# Patient Record
Sex: Female | Born: 1940 | Race: White | Hispanic: No | Smoking: Never smoker
Health system: Southern US, Community
[De-identification: ages and names within clinical notes are randomized; demographics above are authoritative.]

## PROBLEM LIST (undated history)

## (undated) DIAGNOSIS — F419 Anxiety disorder, unspecified: Secondary | ICD-10-CM

## (undated) DIAGNOSIS — E785 Hyperlipidemia, unspecified: Secondary | ICD-10-CM

## (undated) DIAGNOSIS — K219 Gastro-esophageal reflux disease without esophagitis: Secondary | ICD-10-CM

## (undated) HISTORY — PX: APPENDECTOMY: SHX54

## (undated) HISTORY — PX: CHOLECYSTECTOMY: SHX55

---

## 2003-07-17 ENCOUNTER — Other Ambulatory Visit: Payer: Self-pay

## 2004-08-18 ENCOUNTER — Ambulatory Visit: Payer: Self-pay

## 2004-08-18 ENCOUNTER — Ambulatory Visit: Payer: Self-pay | Admitting: Internal Medicine

## 2005-01-11 ENCOUNTER — Ambulatory Visit: Payer: Self-pay | Admitting: Unknown Physician Specialty

## 2005-10-29 ENCOUNTER — Ambulatory Visit: Payer: Self-pay | Admitting: Internal Medicine

## 2006-11-11 ENCOUNTER — Ambulatory Visit: Payer: Self-pay | Admitting: Specialist

## 2007-04-30 ENCOUNTER — Ambulatory Visit: Payer: Self-pay | Admitting: Internal Medicine

## 2008-03-11 ENCOUNTER — Ambulatory Visit: Payer: Self-pay | Admitting: Urology

## 2008-06-07 ENCOUNTER — Ambulatory Visit: Payer: Self-pay | Admitting: Unknown Physician Specialty

## 2009-12-20 ENCOUNTER — Ambulatory Visit: Payer: Self-pay | Admitting: Internal Medicine

## 2010-06-06 ENCOUNTER — Ambulatory Visit: Payer: Self-pay | Admitting: Unknown Physician Specialty

## 2010-06-07 LAB — PATHOLOGY REPORT

## 2011-05-29 ENCOUNTER — Emergency Department: Payer: Self-pay | Admitting: Unknown Physician Specialty

## 2011-05-29 LAB — URINALYSIS, COMPLETE
Bilirubin,UR: NEGATIVE
Ketone: NEGATIVE
Ph: 6 (ref 4.5–8.0)
RBC,UR: 4 /HPF (ref 0–5)
Squamous Epithelial: <1

## 2011-05-29 LAB — COMPREHENSIVE METABOLIC PANEL
Anion Gap: 11 (ref 7–16)
Calcium, Total: 9.3 mg/dL (ref 8.5–10.1)
Chloride: 103 mmol/L (ref 98–107)
Co2: 25 mmol/L (ref 21–32)
EGFR (African American): >60
EGFR (Non-African Amer.): >60
Glucose: 157 mg/dL — ABNORMAL HIGH (ref 65–99)
SGOT(AST): 76 U/L — ABNORMAL HIGH (ref 15–37)
SGPT (ALT): 46 U/L
Sodium: 139 mmol/L (ref 136–145)

## 2011-05-29 LAB — CBC
HCT: 34.1 % — ABNORMAL LOW (ref 35.0–47.0)
HGB: 11.7 g/dL — ABNORMAL LOW (ref 12.0–16.0)
Platelet: 154 10*3/uL (ref 150–440)
RDW: 13.8 % (ref 11.5–14.5)
WBC: 6.8 10*3/uL (ref 3.6–11.0)

## 2011-05-29 LAB — RAPID INFLUENZA A&B ANTIGENS

## 2012-11-10 ENCOUNTER — Ambulatory Visit: Payer: Self-pay | Admitting: Unknown Physician Specialty

## 2012-11-11 LAB — PATHOLOGY REPORT

## 2013-05-09 ENCOUNTER — Emergency Department: Payer: Self-pay | Admitting: Emergency Medicine

## 2013-05-09 LAB — CBC
HGB: 12.8 g/dL (ref 12.0–16.0)
MCHC: 33.5 g/dL (ref 32.0–36.0)
MCV: 85 fL (ref 80–100)
RBC: 4.46 10*6/uL (ref 3.80–5.20)
RDW: 14.3 % (ref 11.5–14.5)
WBC: 7.7 10*3/uL (ref 3.6–11.0)

## 2013-05-09 LAB — BASIC METABOLIC PANEL
Anion Gap: 9 (ref 7–16)
Calcium, Total: 9.7 mg/dL (ref 8.5–10.1)
Chloride: 107 mmol/L (ref 98–107)
Creatinine: 0.78 mg/dL (ref 0.60–1.30)
EGFR (African American): >60
EGFR (Non-African Amer.): >60
Glucose: 151 mg/dL — ABNORMAL HIGH (ref 65–99)
Osmolality: 281 (ref 275–301)
Sodium: 139 mmol/L (ref 136–145)

## 2013-05-09 LAB — TROPONIN I: Troponin-I: <0.02 ng/mL

## 2014-04-01 ENCOUNTER — Emergency Department: Payer: Self-pay | Admitting: Internal Medicine

## 2014-04-01 LAB — URINALYSIS, COMPLETE
BLOOD: NEGATIVE
Bilirubin,UR: NEGATIVE
Glucose,UR: NEGATIVE mg/dL (ref 0–75)
Ketone: NEGATIVE
Nitrite: NEGATIVE
Ph: 7 (ref 4.5–8.0)
Protein: 30
RBC,UR: 5 /HPF (ref 0–5)
Specific Gravity: 1.012 (ref 1.003–1.030)
WBC UR: 3 /HPF (ref 0–5)

## 2014-04-01 LAB — CBC
HCT: 37.5 % (ref 35.0–47.0)
HGB: 12.7 g/dL (ref 12.0–16.0)
MCH: 29.9 pg (ref 26.0–34.0)
MCHC: 33.8 g/dL (ref 32.0–36.0)
MCV: 89 fL (ref 80–100)
Platelet: 151 10*3/uL (ref 150–440)
RBC: 4.24 10*6/uL (ref 3.80–5.20)
RDW: 13.8 % (ref 11.5–14.5)
WBC: 6.9 10*3/uL (ref 3.6–11.0)

## 2014-04-01 LAB — COMPREHENSIVE METABOLIC PANEL
ALK PHOS: 67 U/L
Albumin: 3.8 g/dL (ref 3.4–5.0)
Anion Gap: 7 (ref 7–16)
BILIRUBIN TOTAL: 0.6 mg/dL (ref 0.2–1.0)
BUN: 10 mg/dL (ref 7–18)
CO2: 26 mmol/L (ref 21–32)
Calcium, Total: 8.9 mg/dL (ref 8.5–10.1)
Chloride: 104 mmol/L (ref 98–107)
Creatinine: 0.8 mg/dL (ref 0.60–1.30)
EGFR (African American): >60
EGFR (Non-African Amer.): >60
GLUCOSE: 128 mg/dL — AB (ref 65–99)
OSMOLALITY: 275 (ref 275–301)
Potassium: 3.9 mmol/L (ref 3.5–5.1)
SGOT(AST): 24 U/L (ref 15–37)
SGPT (ALT): 27 U/L
Sodium: 137 mmol/L (ref 136–145)
Total Protein: 6.9 g/dL (ref 6.4–8.2)

## 2017-04-20 ENCOUNTER — Other Ambulatory Visit: Payer: Self-pay

## 2017-04-20 ENCOUNTER — Encounter: Payer: Self-pay | Admitting: Emergency Medicine

## 2017-04-20 ENCOUNTER — Emergency Department
Admission: EM | Admit: 2017-04-20 | Discharge: 2017-04-21 | Disposition: A | Discharge status: Home / Self Care | Payer: Medicare Other | Attending: Emergency Medicine | Admitting: Emergency Medicine

## 2017-04-20 ENCOUNTER — Emergency Department: Payer: Medicare Other

## 2017-04-20 DIAGNOSIS — Y939 Activity, unspecified: Secondary | ICD-10-CM | POA: Insufficient documentation

## 2017-04-20 DIAGNOSIS — R112 Nausea with vomiting, unspecified: Secondary | ICD-10-CM | POA: Insufficient documentation

## 2017-04-20 DIAGNOSIS — M25511 Pain in right shoulder: Secondary | ICD-10-CM

## 2017-04-20 DIAGNOSIS — S40011A Contusion of right shoulder, initial encounter: Secondary | ICD-10-CM | POA: Insufficient documentation

## 2017-04-20 DIAGNOSIS — Y9285 Railroad track as the place of occurrence of the external cause: Secondary | ICD-10-CM | POA: Diagnosis not present

## 2017-04-20 DIAGNOSIS — E785 Hyperlipidemia, unspecified: Secondary | ICD-10-CM | POA: Diagnosis not present

## 2017-04-20 DIAGNOSIS — R197 Diarrhea, unspecified: Secondary | ICD-10-CM | POA: Insufficient documentation

## 2017-04-20 DIAGNOSIS — Y998 Other external cause status: Secondary | ICD-10-CM | POA: Insufficient documentation

## 2017-04-20 HISTORY — DX: Hyperlipidemia, unspecified: E78.5

## 2017-04-20 HISTORY — DX: Gastro-esophageal reflux disease without esophagitis: K21.9

## 2017-04-20 HISTORY — DX: Anxiety disorder, unspecified: F41.9

## 2017-04-20 LAB — URINALYSIS, COMPLETE (UACMP) WITH MICROSCOPIC
Bilirubin Urine: NEGATIVE
GLUCOSE, UA: NEGATIVE mg/dL
HGB URINE DIPSTICK: NEGATIVE
Ketones, ur: NEGATIVE mg/dL
NITRITE: NEGATIVE
Protein, ur: 30 mg/dL — AB
Specific Gravity, Urine: 1.025 (ref 1.005–1.030)
pH: 5 (ref 5.0–8.0)

## 2017-04-20 LAB — COMPREHENSIVE METABOLIC PANEL
ALBUMIN: 4.3 g/dL (ref 3.5–5.0)
ALT: 18 U/L (ref 14–54)
ANION GAP: 10 (ref 5–15)
AST: 34 U/L (ref 15–41)
Alkaline Phosphatase: 57 U/L (ref 38–126)
BILIRUBIN TOTAL: 0.7 mg/dL (ref 0.3–1.2)
BUN: 16 mg/dL (ref 6–20)
CHLORIDE: 104 mmol/L (ref 101–111)
CO2: 22 mmol/L (ref 22–32)
Calcium: 9.5 mg/dL (ref 8.9–10.3)
Creatinine, Ser: 0.88 mg/dL (ref 0.44–1.00)
GFR calc Af Amer: >60 mL/min (ref >60)
GFR calc non Af Amer: >60 mL/min (ref >60)
GLUCOSE: 162 mg/dL — AB (ref 65–99)
POTASSIUM: 4.2 mmol/L (ref 3.5–5.1)
SODIUM: 136 mmol/L (ref 135–145)
TOTAL PROTEIN: 7.2 g/dL (ref 6.5–8.1)

## 2017-04-20 LAB — CBC
HEMATOCRIT: 37.9 % (ref 35.0–47.0)
HEMOGLOBIN: 12.9 g/dL (ref 12.0–16.0)
MCH: 28.9 pg (ref 26.0–34.0)
MCHC: 34 g/dL (ref 32.0–36.0)
MCV: 85 fL (ref 80.0–100.0)
Platelets: 176 10*3/uL (ref 150–440)
RBC: 4.46 MIL/uL (ref 3.80–5.20)
RDW: 14.9 % — ABNORMAL HIGH (ref 11.5–14.5)
WBC: 8.7 10*3/uL (ref 3.6–11.0)

## 2017-04-20 LAB — TROPONIN I

## 2017-04-20 LAB — LIPASE, BLOOD: Lipase: 43 U/L (ref 11–51)

## 2017-04-20 MED ORDER — SODIUM CHLORIDE 0.9 % IV BOLUS (SEPSIS)
1000.0000 mL | Freq: Once | INTRAVENOUS | Status: AC
  Administered 2017-04-20: 1000 mL via INTRAVENOUS

## 2017-04-20 MED ORDER — ACETAMINOPHEN 500 MG PO TABS
1000.0000 mg | ORAL_TABLET | Freq: Once | ORAL | Status: AC
  Administered 2017-04-20: 1000 mg via ORAL

## 2017-04-20 MED ORDER — ACETAMINOPHEN 500 MG PO TABS
ORAL_TABLET | ORAL | Status: AC
  Administered 2017-04-20: 1000 mg via ORAL
  Filled 2017-04-20: qty 2

## 2017-04-20 MED ORDER — ONDANSETRON HCL 4 MG/2ML IJ SOLN
4.0000 mg | Freq: Once | INTRAMUSCULAR | Status: AC | PRN
  Administered 2017-04-20: 4 mg via INTRAVENOUS
  Filled 2017-04-20: qty 2

## 2017-04-20 NOTE — ED Notes (Signed)
Pt resting comfortably in sub wait. Pt states that she is feeling better at this time.

## 2017-04-20 NOTE — ED Triage Notes (Signed)
First nurse note: Patient with complaint of vomiting and diarrhea that started about 5 hours ago. Patient was on a train at the time and when the train started she fell and hit her right shoulder. Patient reports that she was given some nausea medication and that has helped with the nausea.

## 2017-04-20 NOTE — ED Triage Notes (Signed)
Pt reports that she was riding on a train from [facility] and was experiencing N/V/D for the entire train ride. Pt also reports falling into a poll on the train with her right shoulder. Pt denies cardiac symptoms at this time and is in NAD.

## 2017-04-20 NOTE — ED Provider Notes (Signed)
[facility] Emergency Department Provider Note   ____________________________________________   First MD Initiated Contact with Patient 04/20/17 2310     (approximate)  I have reviewed the triage vital signs and the nursing notes.   HISTORY  Chief Complaint Emesis    HPI Jillian Oconnor is a 76 y.o. female who presents to the ED from train with a chief complaint of an/V/D and right shoulder pain.  Patient was heading home on a train from [place], [place] this afternoon when she experienced nausea, vomiting and diarrhea which she thinks was from eating a piece of white cheese.  Went multiple times on the train.  At one point the train jerked to a stop and she fell into a pole, striking her right shoulder.  Denies striking head or LOC.  The train stopped in [place] to let her out.  She was examined by EMTs who gave her an ODT Zofran and she opted to be evaluated here in [place] where she lives rather than to go to [facility].  Reports relief of symptoms from ODT Zofran.  Denies recent fever, chills, chest pain, shortness of breath, dysuria.   Past Medical History:  Diagnosis Date  . Anxiety   . GERD (gastroesophageal reflux disease)   . Hyperlipidemia     There are no active problems to display for this patient.   Past Surgical History:  Procedure Laterality Date  . APPENDECTOMY    . CHOLECYSTECTOMY      Prior to Admission medications   Not on File    Allergies Valium [diazepam]  No family history on file.  Social History Social History   Tobacco Use  . Smoking status: Never Smoker  . Smokeless tobacco: Never Used  Substance Use Topics  . Alcohol use: No    Frequency: Never  . Drug use: No    Review of Systems  Constitutional: No fever/chills. Eyes: No visual changes. ENT: No sore throat. Cardiovascular: Denies chest pain. Respiratory: Denies shortness of breath. Gastrointestinal: No abdominal pain.  Positive for  nausea, vomiting and diarrhea.  No constipation. Genitourinary: Negative for dysuria. Musculoskeletal: Positive for right shoulder pain.  Negative for back pain. Skin: Negative for rash. Neurological: Negative for headaches, focal weakness or numbness.   ____________________________________________   PHYSICAL EXAM:  VITAL SIGNS: ED Triage Vitals  Enc Vitals Group     BP 04/20/17 1956 (!) 122/56     Pulse Rate 04/20/17 1956 85     Resp 04/20/17 1956 (!) 22     Temp 04/20/17 1956 (!) 97.4 F (36.3 C)     Temp Source 04/20/17 1956 Oral     SpO2 04/20/17 1956 94 %     Weight 04/20/17 1957 142 lb (64.4 kg)     Height 04/20/17 1957 5\' 2"  (1.575 m)     Head Circumference --      Peak Flow --      Pain Score 04/20/17 2005 8     Pain Loc --      Pain Edu? --      Excl. in GC? --     Constitutional: Alert and oriented. Well appearing and in no acute distress. Eyes: Conjunctivae are normal. PERRL. EOMI. Head: Atraumatic. Nose: No congestion/rhinnorhea. Mouth/Throat: Mucous membranes are moist.  Oropharynx non-erythematous. Neck: No stridor.  No cervical spine tenderness to palpation. Cardiovascular: Normal rate, regular rhythm. Grossly normal heart sounds.  Good peripheral circulation. Respiratory: Normal respiratory effort.  No retractions. Lungs CTAB. Gastrointestinal: Soft  and nontender to light or deep palpation. No distention. No abdominal bruits. No CVA tenderness. Musculoskeletal: Right anterior shoulder tender to palpation with limited range of motion secondary to pain.  2+ radial pulses.  Brisk, less than 5-second capillary refill.  No lower extremity tenderness nor edema.  No joint effusions. Neurologic:  Normal speech and language. No gross focal neurologic deficits are appreciated. No gait instability. Skin:  Skin is warm, dry and intact. No rash noted. Psychiatric: Mood and affect are normal. Speech and behavior are  normal.  ____________________________________________   LABS (all labs ordered are listed, but only abnormal results are displayed)  Labs Reviewed  COMPREHENSIVE METABOLIC PANEL - Abnormal; Notable for the following components:      Result Value   Glucose, Bld 162 (*)    All other components within normal limits  CBC - Abnormal; Notable for the following components:   RDW 14.9 (*)    All other components within normal limits  URINALYSIS, COMPLETE (UACMP) WITH MICROSCOPIC - Abnormal; Notable for the following components:   Color, Urine YELLOW (*)    APPearance CLEAR (*)    Protein, ur 30 (*)    Leukocytes, UA TRACE (*)    Bacteria, UA RARE (*)    Squamous Epithelial / LPF 0-5 (*)    All other components within normal limits  LIPASE, BLOOD  TROPONIN I   ____________________________________________  EKG  None ____________________________________________  RADIOLOGY  Dg Shoulder Right  Result Date: 04/20/2017 CLINICAL DATA:  Right shoulder pain after falling into a pole on train. EXAM: RIGHT SHOULDER - 2+ VIEW COMPARISON:  None. FINDINGS: There is no evidence of fracture or dislocation. Mild inferior glenoid spurring consistent with osteoarthritis. Tiny subacromial spur. Soft tissues are unremarkable. IMPRESSION: No fracture or subluxation.  Mild osteoarthritis. Electronically Signed   By: Rubye Oaks M.D.   On: 04/20/2017 21:17    ____________________________________________   PROCEDURES  Procedure(s) performed: None  Procedures  Critical Care performed: No  ____________________________________________   INITIAL IMPRESSION / ASSESSMENT AND PLAN / ED COURSE  As part of my medical decision making, I reviewed the following data within the electronic MEDICAL RECORD NUMBER History obtained from family, Nursing notes reviewed and incorporated, Labs reviewed and Notes from prior ED visits.   76 year old female who presents with nausea, vomiting, diarrhea and right  shoulder pain status post striking a pole on the train.  Differential diagnosis includes but is not limited to gastroenteritis, colitis, diverticulitis, UTI.  Initial laboratory results unremarkable.  Awaiting urinalysis result.  Patient received 1L IV fluids prior to my arrival and is feeling much better.  Requested Tylenol for shoulder pain.  Asking for applesauce.  Clinical Course as of Apr 21 30  Wynelle Link Apr 21, 2017  0030 Patient tolerated applesauce without emesis.  Overall feels much better.  Urinalysis notable for trace leukocytes only; patient asymptomatic.  Will culture urine.  Will discharge home with Zofran prescription to use as needed.  Strict return precautions given.  Patient and daughter verbalize understanding and agree with plan of care.  [JS]    Clinical Course User Index [JS] Irean Hong, MD     ____________________________________________   FINAL CLINICAL IMPRESSION(S) / ED DIAGNOSES  Final diagnoses:  Nausea vomiting and diarrhea  Acute pain of right shoulder  Contusion of right shoulder, initial encounter     ED Discharge Orders    None       Note:  This document was prepared using Dragon voice recognition software  and may include unintentional dictation errors.    Irean HongSung, Jade J, MD 04/21/17 [phone]

## 2017-04-21 MED ORDER — ONDANSETRON 4 MG PO TBDP: 4.0000 mg | ORAL_TABLET | Freq: Three times a day (TID) | ORAL | 0 refills | Status: DC | PRN

## 2017-04-21 NOTE — ED Notes (Signed)
Pt reports no c/o nausea, vomiting, or abdominal discomfort after eating apple sauce.

## 2017-04-21 NOTE — Discharge Instructions (Signed)
1.  You may take Zofran as needed for nausea. 2.  Clear liquids times 12 hours, then bland diet x 3 days, then slowly advance diet as tolerated. 3.  Wear sling as needed for comfort. 4.  Return to the ER for worsening symptoms, persistent vomiting, difficulty breathing or other concerns.

## 2018-02-27 ENCOUNTER — Other Ambulatory Visit: Payer: Self-pay | Admitting: Internal Medicine

## 2018-02-27 DIAGNOSIS — R270 Ataxia, unspecified: Secondary | ICD-10-CM

## 2018-03-17 ENCOUNTER — Ambulatory Visit
Admission: RE | Admit: 2018-03-17 | Discharge: 2018-03-17 | Disposition: A | Discharge status: Home / Self Care | Payer: Medicare Other | Source: Ambulatory Visit | Attending: Internal Medicine | Admitting: Internal Medicine

## 2018-03-17 DIAGNOSIS — R251 Tremor, unspecified: Secondary | ICD-10-CM | POA: Insufficient documentation

## 2018-03-17 DIAGNOSIS — I6782 Cerebral ischemia: Secondary | ICD-10-CM | POA: Insufficient documentation

## 2018-03-17 DIAGNOSIS — R296 Repeated falls: Secondary | ICD-10-CM | POA: Diagnosis not present

## 2018-03-17 DIAGNOSIS — R270 Ataxia, unspecified: Secondary | ICD-10-CM | POA: Diagnosis not present

## 2018-03-17 MED ORDER — GADOBUTROL 1 MMOL/ML IV SOLN
6.5000 mL | Freq: Once | INTRAVENOUS | Status: AC | PRN
  Administered 2018-03-17: 6.5 mL via INTRAVENOUS

## 2021-08-17 ENCOUNTER — Encounter: Payer: Self-pay | Admitting: Emergency Medicine

## 2021-08-17 ENCOUNTER — Other Ambulatory Visit: Payer: Self-pay

## 2021-08-17 ENCOUNTER — Emergency Department: Payer: Medicare PPO

## 2021-08-17 ENCOUNTER — Emergency Department
Admission: EM | Admit: 2021-08-17 | Discharge: 2021-08-17 | Disposition: A | Discharge status: Home / Self Care | Payer: Medicare PPO | Attending: Emergency Medicine | Admitting: Emergency Medicine

## 2021-08-17 DIAGNOSIS — M25561 Pain in right knee: Secondary | ICD-10-CM | POA: Diagnosis not present

## 2021-08-17 MED ORDER — ACETAMINOPHEN 325 MG PO TABS
325.0000 mg | ORAL_TABLET | Freq: Once | ORAL | Status: AC
  Administered 2021-08-17: 325 mg via ORAL
  Filled 2021-08-17: qty 1

## 2021-08-17 MED ORDER — OXYCODONE-ACETAMINOPHEN 5-325 MG PO TABS
1.0000 | ORAL_TABLET | Freq: Once | ORAL | Status: AC
  Administered 2021-08-17: 1 via ORAL
  Filled 2021-08-17: qty 1

## 2021-08-17 NOTE — ED Notes (Signed)
Refused crutches, will use her cane that's at home, will wheelchair her to the lobby when her ride gets here ? ?

## 2021-08-17 NOTE — Discharge Instructions (Addendum)
We believe that your symptoms are caused by musculoskeletal pain that may be worse in the short-term because of the injection.  However we believe the pain will most likely be better by tomorrow.  Please consider using either a heating pad or ice packs on your knee, which ever makes you feel better.  Use Tylenol according to label instructions (no more than 2 extra strength Tylenol every 6 hours).   ? ?Follow-up with the doctor listed as recommended or return to the emergency department with new or worsening symptoms that concern you. ?

## 2021-08-17 NOTE — ED Provider Notes (Signed)
? ?[facility] ?Provider Note ? ? ? Event Date/Time  ? First MD Initiated Contact with Patient 08/17/21 0116   ?  (approximate) ? ? ?History  ? ?Knee Pain ? ? ?HPI ? ?Jillian Oconnor is a 81 y.o. female who has been having right-sided knee pain for a couple of weeks.  She presents for acute worsening of that pain.  She says it is in the top of the knee and on the inside, but not in the back.  She has had no swelling.  She has been seen twice at [facility] clinic including just yesterday where she saw Mahalia Longest who gave her an injection to try to help with the pain..  The thought is that she has worsening arthritis and they are going to try managing it through [facility] clinic before referring her to orthopedics. ? ?She said that she was doing fine and was even able to walk and drive herself to and from the appointment today.  However tonight after sitting still for a while, she got up and try to walk and the pain was much worse and she felt like she could not bear weight.  There is no swelling of the area and no swelling anywhere else on her leg.  No history of blood clots to the legs of the lungs.  No recent immobilizations.  Pain seem to be acutely worse before coming into the ED but she said it actually feels better than it did before.  She cannot tolerate NSAIDs and was recently on a prednisone taper to see if it would help. ?  ? ? ?Physical Exam  ? ?Triage Vital Signs: ?ED Triage Vitals  ?Enc Vitals Group  ?   BP 08/17/21 0018 (!) 188/86  ?   Pulse Rate 08/17/21 0018 85  ?   Resp --   ?   Temp 08/17/21 0018 98.1 ?F (36.7 ?C)  ?   Temp Source 08/17/21 0018 Oral  ?   SpO2 08/17/21 0018 97 %  ?   Weight 08/17/21 0008 47.6 kg (105 lb)  ?   Height 08/17/21 0008 1.27 m (4\' 2" )  ?   Head Circumference --   ?   Peak Flow --   ?   Pain Score 08/17/21 0007 9  ?   Pain Loc --   ?   Pain Edu? --   ?   Excl. in GC? --   ? ? ?Most recent vital signs: ?Vitals:  ? 08/17/21 0018 08/17/21 0138  ?BP:  (!) 188/86 (!) 164/98  ?Pulse: 85   ?Resp:  16  ?Temp: 98.1 ?F (36.7 ?C) 98 ?F (36.7 ?C)  ?SpO2: 97% 98%  ? ? ? ?General: Awake, no distress.  ?CV:  Good peripheral perfusion.  ?Resp:  Normal effort.  ?Abd:  No distention.  ?Other:  I see the injection site on the right knee from earlier today/yesterday, but there is no gross swelling and no evidence of cellulitis or effusion.  The patient is flexing and extending her knee without any difficulty.  No evidence of septic arthritis.  She reports pain when she bears weight but she now is able to tolerate weightbearing and taking a few steps.  No tenderness to palpation of the calf, no induration along venous lines.  No pain or tenderness in the popliteal fossa.  No swelling of the right leg compared to the left. ? ? ?ED Results / Procedures / Treatments  ? ? ?RADIOLOGY ? ?I personally  reviewed the patient's 4 view knee x-rays.  I see no evidence of any acute abnormality.  Radiology report confirms the same. ? ? ?PROCEDURES: ? ?Critical Care performed: No ? ?Procedures ? ? ?MEDICATIONS ORDERED IN ED: ?Medications  ?oxyCODONE-acetaminophen (PERCOCET/ROXICET) 5-325 MG per tablet 1 tablet (1 tablet Oral Given 08/17/21 0133)  ?acetaminophen (TYLENOL) tablet 325 mg (325 mg Oral Given 08/17/21 0133)  ? ? ? ?IMPRESSION / MDM / ASSESSMENT AND PLAN / ED COURSE  ?I reviewed the triage vital signs and the nursing notes. ?             ?               ? ?Differential diagnosis includes, but is not limited to, chronic arthritis with acute worsening, acute worsening of pain after injection, septic arthritis, cellulitis, DVT. ? ?No evidence of DVT.  The patient's pain has been ongoing for a while and is much more consistent with arthritis.  I believe that she has chronic right knee pain that was acutely worse today after the injection.  She even said that it was worse when she tried to walk after sitting for a while and I believe this is due to the injection.  I reviewed her x-rays as  documented above and they are reassuring.  Her vital signs are stable other than some hypertension.  There is no indication for further imaging.  The patient says that the pain is better than it was before but just wants something for pain to make it go back to the way it was before the injection. ? ?I suggested that we give her 1 Percocet tonight and at the 325 mg acetaminophen.  She is comfortable with that plan and she will call the clinic in the morning for follow-up appointment.  She declines crutches and she declines orthopedic referral.  I gave my usual and customary return precautions. ? ? ? ? ?  ? ? ?FINAL CLINICAL IMPRESSION(S) / ED DIAGNOSES  ? ?Final diagnoses:  ?Acute pain of right knee  ? ? ? ?Rx / DC Orders  ? ?ED Discharge Orders   ? ? None  ? ?  ? ? ? ?Note:  This document was prepared using Dragon voice recognition software and may include unintentional dictation errors. ?  ?Loleta Rose, MD ?08/17/21 0203 ? ?

## 2021-08-17 NOTE — ED Triage Notes (Signed)
Pt to triage via recliner with no distress noted; received cortisone inj to rt knee today for "arthritis"; c/o increasing rt knee pain; denies any new injury ?

## 2023-07-19 ENCOUNTER — Emergency Department (HOSPITAL_COMMUNITY): Payer: Medicare PPO

## 2023-07-19 ENCOUNTER — Other Ambulatory Visit: Payer: Self-pay

## 2023-07-19 ENCOUNTER — Encounter (HOSPITAL_COMMUNITY): Payer: Self-pay

## 2023-07-19 ENCOUNTER — Inpatient Hospital Stay (HOSPITAL_COMMUNITY): Payer: Medicare PPO

## 2023-07-19 ENCOUNTER — Inpatient Hospital Stay (HOSPITAL_COMMUNITY)
Admission: EM | Admit: 2023-07-19 | Discharge: 2023-07-27 | DRG: 023 | Disposition: E | Payer: Medicare PPO | Attending: Internal Medicine | Admitting: Internal Medicine

## 2023-07-19 DIAGNOSIS — Z1152 Encounter for screening for COVID-19: Secondary | ICD-10-CM | POA: Diagnosis not present

## 2023-07-19 DIAGNOSIS — I10 Essential (primary) hypertension: Secondary | ICD-10-CM | POA: Diagnosis present

## 2023-07-19 DIAGNOSIS — K219 Gastro-esophageal reflux disease without esophagitis: Secondary | ICD-10-CM | POA: Diagnosis present

## 2023-07-19 DIAGNOSIS — I6389 Other cerebral infarction: Secondary | ICD-10-CM | POA: Diagnosis not present

## 2023-07-19 DIAGNOSIS — G9341 Metabolic encephalopathy: Secondary | ICD-10-CM | POA: Diagnosis present

## 2023-07-19 DIAGNOSIS — G8191 Hemiplegia, unspecified affecting right dominant side: Secondary | ICD-10-CM | POA: Diagnosis present

## 2023-07-19 DIAGNOSIS — Z888 Allergy status to other drugs, medicaments and biological substances status: Secondary | ICD-10-CM

## 2023-07-19 DIAGNOSIS — I609 Nontraumatic subarachnoid hemorrhage, unspecified: Secondary | ICD-10-CM | POA: Diagnosis present

## 2023-07-19 DIAGNOSIS — E876 Hypokalemia: Secondary | ICD-10-CM | POA: Diagnosis not present

## 2023-07-19 DIAGNOSIS — E785 Hyperlipidemia, unspecified: Secondary | ICD-10-CM | POA: Diagnosis present

## 2023-07-19 DIAGNOSIS — R1310 Dysphagia, unspecified: Secondary | ICD-10-CM | POA: Diagnosis present

## 2023-07-19 DIAGNOSIS — Z79899 Other long term (current) drug therapy: Secondary | ICD-10-CM

## 2023-07-19 DIAGNOSIS — R4701 Aphasia: Secondary | ICD-10-CM | POA: Diagnosis present

## 2023-07-19 DIAGNOSIS — R29810 Facial weakness: Secondary | ICD-10-CM | POA: Diagnosis present

## 2023-07-19 DIAGNOSIS — I63412 Cerebral infarction due to embolism of left middle cerebral artery: Secondary | ICD-10-CM | POA: Diagnosis present

## 2023-07-19 DIAGNOSIS — Z7982 Long term (current) use of aspirin: Secondary | ICD-10-CM

## 2023-07-19 DIAGNOSIS — J95821 Acute postprocedural respiratory failure: Secondary | ICD-10-CM | POA: Diagnosis not present

## 2023-07-19 DIAGNOSIS — Z7902 Long term (current) use of antithrombotics/antiplatelets: Secondary | ICD-10-CM

## 2023-07-19 DIAGNOSIS — I161 Hypertensive emergency: Secondary | ICD-10-CM | POA: Diagnosis present

## 2023-07-19 DIAGNOSIS — I63512 Cerebral infarction due to unspecified occlusion or stenosis of left middle cerebral artery: Secondary | ICD-10-CM

## 2023-07-19 DIAGNOSIS — Z515 Encounter for palliative care: Secondary | ICD-10-CM

## 2023-07-19 DIAGNOSIS — R001 Bradycardia, unspecified: Secondary | ICD-10-CM | POA: Diagnosis present

## 2023-07-19 DIAGNOSIS — F419 Anxiety disorder, unspecified: Secondary | ICD-10-CM | POA: Diagnosis present

## 2023-07-19 DIAGNOSIS — I471 Supraventricular tachycardia, unspecified: Secondary | ICD-10-CM | POA: Diagnosis not present

## 2023-07-19 DIAGNOSIS — I6602 Occlusion and stenosis of left middle cerebral artery: Secondary | ICD-10-CM | POA: Diagnosis present

## 2023-07-19 DIAGNOSIS — I63531 Cerebral infarction due to unspecified occlusion or stenosis of right posterior cerebral artery: Secondary | ICD-10-CM | POA: Diagnosis present

## 2023-07-19 DIAGNOSIS — R569 Unspecified convulsions: Secondary | ICD-10-CM | POA: Diagnosis not present

## 2023-07-19 DIAGNOSIS — Z791 Long term (current) use of non-steroidal anti-inflammatories (NSAID): Secondary | ICD-10-CM

## 2023-07-19 DIAGNOSIS — Z66 Do not resuscitate: Secondary | ICD-10-CM | POA: Diagnosis present

## 2023-07-19 DIAGNOSIS — R7303 Prediabetes: Secondary | ICD-10-CM | POA: Diagnosis present

## 2023-07-19 DIAGNOSIS — R29712 NIHSS score 12: Secondary | ICD-10-CM | POA: Diagnosis present

## 2023-07-19 DIAGNOSIS — J9601 Acute respiratory failure with hypoxia: Secondary | ICD-10-CM | POA: Diagnosis not present

## 2023-07-19 DIAGNOSIS — I639 Cerebral infarction, unspecified: Principal | ICD-10-CM | POA: Diagnosis present

## 2023-07-19 LAB — COMPREHENSIVE METABOLIC PANEL
ALT: 15 U/L (ref 0–44)
AST: 22 U/L (ref 15–41)
Albumin: 3.9 g/dL (ref 3.5–5.0)
Alkaline Phosphatase: 46 U/L (ref 38–126)
Anion gap: 10 (ref 5–15)
BUN: 17 mg/dL (ref 8–23)
CO2: 25 mmol/L (ref 22–32)
Calcium: 9.8 mg/dL (ref 8.9–10.3)
Chloride: 104 mmol/L (ref 98–111)
Creatinine, Ser: 0.8 mg/dL (ref 0.44–1.00)
GFR, Estimated: >60 mL/min (ref >60)
Glucose, Bld: 135 mg/dL — ABNORMAL HIGH (ref 70–99)
Potassium: 4 mmol/L (ref 3.5–5.1)
Sodium: 139 mmol/L (ref 135–145)
Total Bilirubin: 0.4 mg/dL (ref 0.0–1.2)
Total Protein: 6.4 g/dL — ABNORMAL LOW (ref 6.5–8.1)

## 2023-07-19 LAB — DIFFERENTIAL
Abs Immature Granulocytes: 0.02 10*3/uL (ref 0.00–0.07)
Basophils Absolute: 0 10*3/uL (ref 0.0–0.1)
Basophils Relative: 0 %
Eosinophils Absolute: 0.1 10*3/uL (ref 0.0–0.5)
Eosinophils Relative: 2 %
Immature Granulocytes: 0 %
Lymphocytes Relative: 16 %
Lymphs Abs: 1.1 10*3/uL (ref 0.7–4.0)
Monocytes Absolute: 0.4 10*3/uL (ref 0.1–1.0)
Monocytes Relative: 5 %
Neutro Abs: 5.5 10*3/uL (ref 1.7–7.7)
Neutrophils Relative %: 77 %

## 2023-07-19 LAB — I-STAT CHEM 8, ED
BUN: 18 mg/dL (ref 8–23)
Calcium, Ion: 1.16 mmol/L (ref 1.15–1.40)
Chloride: 104 mmol/L (ref 98–111)
Creatinine, Ser: 0.8 mg/dL (ref 0.44–1.00)
Glucose, Bld: 128 mg/dL — ABNORMAL HIGH (ref 70–99)
HCT: 36 % (ref 36.0–46.0)
Hemoglobin: 12.2 g/dL (ref 12.0–15.0)
Potassium: 4 mmol/L (ref 3.5–5.1)
Sodium: 139 mmol/L (ref 135–145)
TCO2: 24 mmol/L (ref 22–32)

## 2023-07-19 LAB — CBC
HCT: 38.2 % (ref 36.0–46.0)
Hemoglobin: 12.9 g/dL (ref 12.0–15.0)
MCH: 29.3 pg (ref 26.0–34.0)
MCHC: 33.8 g/dL (ref 30.0–36.0)
MCV: 86.6 fL (ref 80.0–100.0)
Platelets: 188 10*3/uL (ref 150–400)
RBC: 4.41 MIL/uL (ref 3.87–5.11)
RDW: 13.3 % (ref 11.5–15.5)
WBC: 7.1 10*3/uL (ref 4.0–10.5)
nRBC: 0 % (ref 0.0–0.2)

## 2023-07-19 LAB — APTT: aPTT: 24 s (ref 24–36)

## 2023-07-19 LAB — CBG MONITORING, ED: Glucose-Capillary: 125 mg/dL — ABNORMAL HIGH (ref 70–99)

## 2023-07-19 LAB — PROTIME-INR
INR: 1 (ref 0.8–1.2)
Prothrombin Time: 13.7 s (ref 11.4–15.2)

## 2023-07-19 LAB — RESP PANEL BY RT-PCR (RSV, FLU A&B, COVID)  RVPGX2
Influenza A by PCR: NEGATIVE
Influenza B by PCR: NEGATIVE
Resp Syncytial Virus by PCR: NEGATIVE
SARS Coronavirus 2 by RT PCR: NEGATIVE

## 2023-07-19 LAB — MRSA NEXT GEN BY PCR, NASAL: MRSA by PCR Next Gen: NOT DETECTED

## 2023-07-19 LAB — ETHANOL: Alcohol, Ethyl (B): <10 mg/dL (ref <10)

## 2023-07-19 LAB — HEMOGLOBIN A1C
Hgb A1c MFr Bld: 5.3 % (ref 4.8–5.6)
Mean Plasma Glucose: 105.41 mg/dL

## 2023-07-19 MED ORDER — PHENYLEPHRINE HCL-NACL 20-0.9 MG/250ML-% IV SOLN
0.0000 ug/min | INTRAVENOUS | Status: DC | PRN
  Administered 2023-07-20: 20 ug/min via INTRAVENOUS
  Filled 2023-07-19: qty 250

## 2023-07-19 MED ORDER — ACETAMINOPHEN 325 MG PO TABS: 650.0000 mg | ORAL_TABLET | ORAL | Status: DC | PRN

## 2023-07-19 MED ORDER — CLOPIDOGREL BISULFATE 300 MG PO TABS
300.0000 mg | ORAL_TABLET | Freq: Once | ORAL | Status: AC
  Administered 2023-07-19: 300 mg via ORAL
  Filled 2023-07-19: qty 1

## 2023-07-19 MED ORDER — ACETAMINOPHEN 650 MG RE SUPP: 650.0000 mg | RECTAL | Status: DC | PRN

## 2023-07-19 MED ORDER — ASPIRIN 325 MG PO TABS
650.0000 mg | ORAL_TABLET | Freq: Once | ORAL | Status: AC
  Administered 2023-07-19: 650 mg via ORAL
  Filled 2023-07-19: qty 2

## 2023-07-19 MED ORDER — SODIUM CHLORIDE 0.9 % IV SOLN: INTRAVENOUS | Status: DC

## 2023-07-19 MED ORDER — ENOXAPARIN SODIUM 40 MG/0.4ML IJ SOSY: 40.0000 mg | PREFILLED_SYRINGE | INTRAMUSCULAR | Status: DC

## 2023-07-19 MED ORDER — SENNOSIDES-DOCUSATE SODIUM 8.6-50 MG PO TABS: 1.0000 | ORAL_TABLET | Freq: Every evening | ORAL | Status: DC | PRN

## 2023-07-19 MED ORDER — ASPIRIN 300 MG RE SUPP: 300.0000 mg | Freq: Every day | RECTAL | Status: DC

## 2023-07-19 MED ORDER — STROKE: EARLY STAGES OF RECOVERY BOOK
Freq: Once | Status: AC
  Administered 2023-07-20: 1
  Filled 2023-07-19: qty 1

## 2023-07-19 MED ORDER — SODIUM CHLORIDE 0.9 % NICU IV INFUSION SIMPLE: 1000.0000 mL | INJECTION | Freq: Once | INTRAVENOUS | Status: DC

## 2023-07-19 MED ORDER — ASPIRIN 81 MG PO CHEW: 81.0000 mg | CHEWABLE_TABLET | Freq: Every day | ORAL | Status: DC

## 2023-07-19 MED ORDER — SODIUM CHLORIDE 0.9 % IV SOLN: Freq: Once | INTRAVENOUS | Status: AC

## 2023-07-19 MED ORDER — ACETAMINOPHEN 160 MG/5ML PO SOLN
650.0000 mg | ORAL | Status: DC | PRN
  Administered 2023-07-21 (×2): 650 mg
  Filled 2023-07-19 (×2): qty 20.3

## 2023-07-19 MED ORDER — CHLORHEXIDINE GLUCONATE CLOTH 2 % EX PADS
6.0000 | MEDICATED_PAD | Freq: Every day | CUTANEOUS | Status: DC
  Administered 2023-07-19 – 2023-07-21 (×2): 6 via TOPICAL

## 2023-07-19 MED ORDER — SODIUM CHLORIDE 0.9 % IV BOLUS
500.0000 mL | Freq: Once | INTRAVENOUS | Status: AC
  Administered 2023-07-19: 500 mL via INTRAVENOUS

## 2023-07-19 MED ORDER — IOHEXOL 350 MG/ML SOLN
100.0000 mL | Freq: Once | INTRAVENOUS | Status: AC | PRN
  Administered 2023-07-19: 100 mL via INTRAVENOUS

## 2023-07-19 MED ORDER — CLOPIDOGREL BISULFATE 75 MG PO TABS: 75.0000 mg | ORAL_TABLET | Freq: Every day | ORAL | Status: DC

## 2023-07-19 NOTE — Progress Notes (Signed)
 Pt to MRI, VS remained stable. BP (!) 148/62   Pulse 83   Temp 98.2 F (36.8 C) (Oral)   Resp (!) 25   Wt 69.1 kg   SpO2 96%   BMI 42.84 kg/m  Pt remained on monitor alert and oriented.

## 2023-07-19 NOTE — Consult Note (Deleted)
 err

## 2023-07-19 NOTE — Code Documentation (Addendum)
 Jillian Oconnor is a 83 yr old female with an unknown PMH arriving to [facility] via EMS on 07/19/2023. Pt is coming from home where she was LKW today at 8:30 AM and is now C/O Aphasia. Per EMS she is not on any thinners.    Code stroke activated after pt arrival. Pt met by stroke team at bridge. CBG, labs obtained. Pt to CT with team. NIHSS 8. Pt is aphasic and unable to answer orientation questions. (Please see documentation for NIHSS details and timeline). The following imaging was completed: CT, CTA and P. Per Dr Wilford Corner, CT negative for hemorrhage. Per Dr. Wilford Corner, CTA shows stenosis of Lt MCA. She will be admitted to [facility] ICU for close observation. [employer] [place] [employer] notified of need for bed. Pt will need q 1 hr VS and NIHSS.  Contact Neurology STAT for any neurological worsening. Bedside handoff with ED RN complete.

## 2023-07-19 NOTE — Progress Notes (Signed)
 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  eICU Interventions  New Patient Evaluation.        Thomasene Lot Jaryn Hocutt 07/19/2023, 8:15 PM

## 2023-07-19 NOTE — ED Provider Notes (Signed)
 [place] EMERGENCY DEPARTMENT AT [facility] Provider Note   CSN: [id number] Arrival date & time: 07/19/23  1744     History  Chief complaint: Acute stroke  Jillian Oconnor is a 83 y.o. female.  HPI   Patient was last seen normal this morning at 0 830.  She was brought into the ED for evaluation of a possible stroke.  Patient was noted to have facial drooping on the right side of her face.  Patient has been aphasic and not speaking.  sHe was also noted to have right-sided weakness.  Patient looks at me but she is not able to speak.  Home Medications Prior to Admission medications   Medication Sig Start Date End Date Taking? Authorizing Provider  ondansetron (ZOFRAN ODT) 4 MG disintegrating tablet Take 1 tablet (4 mg total) by mouth every 8 (eight) hours as needed for nausea or vomiting. 04/21/17   Irean Hong, MD      Allergies    Valium [diazepam]    Review of Systems   Review of Systems  Physical Exam Updated Vital Signs Wt 70.4 kg   BMI 43.65 kg/m  Physical Exam Vitals and nursing note reviewed.  Constitutional:      General: She is not in acute distress.    Appearance: She is well-developed.  HENT:     Head: Normocephalic and atraumatic.     Right Ear: External ear normal.     Left Ear: External ear normal.  Eyes:     General: No scleral icterus.       Right eye: No discharge.        Left eye: No discharge.     Conjunctiva/sclera: Conjunctivae normal.  Neck:     Trachea: No tracheal deviation.  Cardiovascular:     Rate and Rhythm: Normal rate and regular rhythm.  Pulmonary:     Effort: Pulmonary effort is normal. No respiratory distress.     Breath sounds: Normal breath sounds. No stridor.  Abdominal:     General: Abdomen is flat. There is no distension.     Tenderness: There is no abdominal tenderness.  Musculoskeletal:        General: No swelling or deformity.     Cervical back: Neck supple.  Skin:    General: Skin is warm  and dry.     Findings: No rash.  Neurological:     Mental Status: She is alert.     Cranial Nerves: Facial asymmetry present.     Motor: Weakness present. No seizure activity.     Comments: Patient nonverbal, she does look at me and follow commands, able to squeeze my hands with both extremities, weaker on the right     ED Results / Procedures / Treatments   Labs (all labs ordered are listed, but only abnormal results are displayed) Labs Reviewed  I-STAT CHEM 8, ED - Abnormal; Notable for the following components:      Result Value   Glucose, Bld 128 (*)    All other components within normal limits  CBG MONITORING, ED - Abnormal; Notable for the following components:   Glucose-Capillary 125 (*)    All other components within normal limits  CBC  DIFFERENTIAL  PROTIME-INR  APTT  COMPREHENSIVE METABOLIC PANEL  ETHANOL    EKG None  Radiology CT HEAD CODE STROKE WO CONTRAST Result Date: 07/19/2023 CLINICAL DATA:  Code stroke. EXAM: CT HEAD WITHOUT CONTRAST TECHNIQUE: Contiguous axial images were obtained from the base of the  skull through the vertex without intravenous contrast. RADIATION DOSE REDUCTION: This exam was performed according to the departmental dose-optimization program which includes automated exposure control, adjustment of the mA and/or kV according to patient size and/or use of iterative reconstruction technique. COMPARISON:  MR head without contrast scratched at MR head without and with contrast 03/17/2018 FINDINGS: Brain: Mild atrophy and white matter changes demonstrate some progression since the prior exam. No acute infarct, hemorrhage, or mass lesion is present. The ventricles are of normal size. Deep brain nuclei are within normal limits. Insert normal brainstem Midline structures are within normal limits. Vascular: Mild atherosclerotic calcifications are present within the cavernous internal carotid arteries bilaterally. No hyperdense vessel is present. Skull:  Calvarium is intact. No focal lytic or blastic lesions are present. No significant extracranial soft tissue lesion is present. Sinuses/Orbits: The paranasal sinuses and mastoid air cells are clear. The globes and orbits are within normal limits. ASPECTS [facility] Stroke Program Early CT Score) - Ganglionic level infarction (caudate, lentiform nuclei, internal capsule, insula, M1-M3 cortex): 7/7 - Supraganglionic infarction (M4-M6 cortex): 3/3 Total score (0-10 with 10 being normal): 10/10 IMPRESSION: 1. No acute intracranial abnormality or significant interval change. Aspects is 10/10 2. Slight progression of mild atrophy and white matter disease. 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 Electronically Signed   By: Marin Roberts M.D.   On: 07/19/2023 18:04    Procedures Procedures    Medications Ordered in ED Medications  iohexol (OMNIPAQUE) 350 MG/ML injection 100 mL (100 mLs Intravenous Contrast Given 07/19/23 1811)    ED Course/ Medical Decision Making/ A&P                                 Medical Decision Making Problems Addressed: Cerebrovascular accident (CVA), unspecified mechanism (HCC): acute illness or injury that poses a threat to life or bodily functions  Amount and/or Complexity of Data Reviewed Labs: ordered. Decision-making details documented in ED Course. Radiology: ordered.  Risk Decision regarding hospitalization.   Patient presented as an acute stroke.  She is LVO positive.  Patient was seen by stroke team including Dr. Jerrell Belfast on arrival.  Patient's CT angiogram shows high-grade stenosis of the mid left M1 segment as well as moderate proximal M2 stenosis on the left.  Stroke team is currently in discussion with possible neurovascular intervention.          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

## 2023-07-19 NOTE — H&P (Signed)
 NEUROLOGY CONSULT NOTE   Date of service: July 19, 2023 Patient Name: Jillian Oconnor MRN:  [id number] DOB:  1941/01/13 Chief Complaint: "aphasia, right weakness" Requesting Provider: Linwood Dibbles, MD  History of Present Illness  Jillian Oconnor is a 83 y.o. female with hx of hyperlipidemia, anxiety, GERD presented from home for concern for strokelike symptoms. Last known well you still felt that around 8:30 AM, usually socializes with neighbors, lives independently and later was noted to be aphasic and appearing confused not able to answer questions.  Outside the window for IV TNKase and per protocol brought to [facility] stroke [facility] for possible consideration of thrombectomy since the fast ED score was 4 for the EMS. Attempts were made to reach family but wrong number was provided by the EMS and it took [place] a while to reach her daughter eventually after getting the number from the neighbor. The daughter lives in [place] and is on her way here. Patient was not able to provide meaningful history.  LKW: 8:30 AM Modified rankin score: 0-Completely asymptomatic and back to baseline post- stroke-lives independently, does not use a walker or cane to walk IV Thrombolysis: Outside the window EVT: No-no perfusion deficit, no occlusion-rather significant stenosis in the left M1 and M2 branches.  NIHSS components Score: Comment  1a Level of Conscious 0[x]  1[]  2[]  3[]      1b LOC Questions 0[]  1[]  2[x]       1c LOC Commands 0[x]  1[]  2[]       2 Best Gaze 0[x]  1[]  2[]       3 Visual 0[x]  1[]  2[]  3[]      4 Facial Palsy 0[]  1[x]  2[]  3[]      5a Motor Arm - left 0[x]  1[]  2[]  3[]  4[]  UN[]    5b Motor Arm - Right 0[]  1[x]  2[]  3[]  4[]  UN[]    6a Motor Leg - Left 0[x]  1[]  2[]  3[]  4[]  UN[]    6b Motor Leg - Right 0[]  1[x]  2[]  3[]  4[]  UN[]    7 Limb Ataxia 0[x]  1[]  2[]  3[]  UN[]     8 Sensory 0[x]  1[]  2[]  UN[]      9 Best Language 0[]  1[]  2[x]  3[]      10 Dysarthria 0[x]   1[]  2[]  UN[]      11 Extinct. and Inattention 0[x]  1[]  2[]       TOTAL: 7      ROS  Unable to perform due to her aphasia  Past History   Past Medical History:  Diagnosis Date   Anxiety    GERD (gastroesophageal reflux disease)    Hyperlipidemia     Past Surgical History:  Procedure Laterality Date   APPENDECTOMY     CHOLECYSTECTOMY      Family History: No family history on file.  Social History  reports that she has never smoked. She has never used smokeless tobacco. She reports that she does not drink alcohol and does not use drugs.  Allergies  Allergen Reactions   Valium [Diazepam] Anaphylaxis    Medications  No current facility-administered medications for this encounter.  Current Outpatient Medications:    ondansetron (ZOFRAN ODT) 4 MG disintegrating tablet, Take 1 tablet (4 mg total) by mouth every 8 (eight) hours as needed for nausea or vomiting., Disp: 20 tablet, Rfl: 0  Vitals   Vitals:   07/19/23 1753  Weight: 70.4 kg    Body mass index is 43.65 kg/m.  Physical Exam  General: Awake alert aphasic HEENT: Normocephalic atraumatic Chest clear Cardiovascular: Regular rhythm Abdomen nondistended nontender  Neurological exam Awake alert. Minimal verbal output initially but when asked to name objects, attempted to mouth some words that were garbled.  When asked to repeat a sentence, she was able to repeat sentences.  She was able to say her name out loud. She was able to follow commands without a problem. Essentially exclusively expressive aphasia. Unable to tell me her age or month. Cranial nerves: Pupils equal round react light, extraocular movements appear unhindered, visual fields appear full although EMS reported that she had some leftward gaze preference I did not appreciate it.  Subtle right nasolabial fold flattening.  Tongue and palate midline. Motor examination with minimal drift of the right upper and lower extremity which was rapidly  improving. Sensation intact to light touch Coordination with no gross dysmetria  Labs/Imaging/Neurodiagnostic studies   CBC:  Recent Labs  Lab Aug 06, 2023 1758  HGB 12.2  HCT 36.0   Basic Metabolic Panel:  Lab Results  Component Value Date   NA 139 08-06-23   K 4.0 2023/08/06   CO2 22 04/20/2017   GLUCOSE 128 (H) Aug 06, 2023   BUN 18 2023/08/06   CREATININE 0.80 06-Aug-2023   CALCIUM 9.5 04/20/2017   GFRNONAA >60 04/20/2017   GFRAA >60 04/20/2017   CT Head without contrast(Personally reviewed): Aspects 10  CT perfusion and CT angio Head and Neck with contrast(Personally reviewed): High-grade stenosis of the mid left M1, moderate proximal M2 stenosis on the left.  Asymmetric attenuation of the left MCA branches vessels compared to the right.  No infarct on core CT perfusion although no ischemic penumbra is present utilizing the Tmax greater than 6 seconds, a penumbra I suggested using a shorter timeframe cut off.  Atherosclerotic changes at the right carotid bifurcation producing greater than 50% stenosis of the proximal right ICA.  No significant stenosis of the left carotid bifurcation or vertebral arteries in the neck.    ASSESSMENT   Jillian Oconnor is a 83 y.o. female with above past medical history documented in the HPI, presenting for concern for symptoms localizable to the left MCA territory. CT head showed no acute abnormalities CT angiography of the head and neck showed high-grade stenosis of the mid left M1 segment and moderate proximal M2 stenosis in the left M2 branches along with asymmetric attenuation of the left MCA branch vessels in comparison to the right.  Although no core infarct was seen using Tmax greater than 6 seconds criteria, large penumbra suggested using a shorter timeframe cut off. I discussed this case with our interventionalist, who at this time was anticipating another patient and was unable to take this case. I then reached out to [facility] interventionalist and discussed the case in detail after obtaining permission from the family which caused significant delays. In my discussion with both interventionalists, they are of the opinion that without an actual occlusion, medical management with increasing blood pressures and watching closely in the ICU might be a better approach than trying to revascularize at this point.. My rebuttal to that was that the patient does not have significant atherosclerotic disease and these "significant stenosis" may be subocclusive thrombus, and she might worsen significantly if the collaterals fail.  Upon detailed discussion with both the internal and external neurointerventionalist, decision was made to first medically manage and watch her in the ICU and if she worsens, then consider arteriogram and possible thrombectomy  Impression: Left middle cerebral artery infarction  RECOMMENDATIONS  Admit to the neuro ICU for close monitoring Every hour neurochecks  Call neurology stat if any worsening in her exam.  Will consider repeat CT perfusion study and possible consideration for thrombectomy. MRI of the brain without contrast. 2D echocardiogram A1c Lipid panel Therapy assessments Keep head of the bed down, keep blood pressure high.  Blood pressure goal is going to be between 140-220 at this time.  For now I am giving her a fluid bolus and her pressures are well within that goal. I will load her with aspirin and Plavix at this time.  Continue aspirin 81+ Plavix 75 from tomorrow. Case personally signed out to the overnight neurologist. Care discussed in detail with daughter Midge Minium over the phone.  She is driving from [place]  Delays: Getting family's phone number to get consent for the procedure.  Concurrent case on the table with our neurointerventionalist.  Discussion of the case in detail with the outside  neurointerventionalist. ______________________________________________________________________    Signed, Milon Dikes, MD [facility]  CRITICAL CARE ATTESTATION Performed by: Milon Dikes, MD Total critical care time: 100 minutes Critical care time was exclusive of separately billable procedures and treating other patients and/or supervising APPs/Residents/Students Critical care was necessary to treat or prevent imminent or life-threatening deterioration. This patient is critically ill and at significant risk for neurological worsening and/or death and care requires constant monitoring. Critical care was time spent personally by me on the following activities: development of treatment plan with patient and/or surrogate as well as nursing, discussions with consultants, evaluation of patient's response to treatment, examination of patient, obtaining history from patient or surrogate, ordering and performing treatments and interventions, ordering and review of laboratory studies, ordering and review of radiographic studies, pulse oximetry, re-evaluation of patient's condition, participation in multidisciplinary rounds and medical decision making of high complexity in the care of this patient.

## 2023-07-19 NOTE — ED Triage Notes (Signed)
 Pt BIB EMS from home for code stroke. LSN 0830. Denies blood thinners. Deficits- right sided facial droop, can not cross midline, right sided weakness, and expressive aphasia. Usually axox4.

## 2023-07-20 ENCOUNTER — Inpatient Hospital Stay (HOSPITAL_COMMUNITY): Payer: Medicare PPO

## 2023-07-20 ENCOUNTER — Encounter (HOSPITAL_COMMUNITY): Admission: EM | Disposition: E | Payer: Self-pay | Source: Home / Self Care | Attending: Neurology

## 2023-07-20 ENCOUNTER — Other Ambulatory Visit (HOSPITAL_COMMUNITY): Payer: Medicare PPO

## 2023-07-20 ENCOUNTER — Inpatient Hospital Stay (HOSPITAL_COMMUNITY): Payer: Medicare PPO | Admitting: Certified Registered Nurse Anesthetist

## 2023-07-20 DIAGNOSIS — I63412 Cerebral infarction due to embolism of left middle cerebral artery: Secondary | ICD-10-CM

## 2023-07-20 DIAGNOSIS — I10 Essential (primary) hypertension: Secondary | ICD-10-CM

## 2023-07-20 DIAGNOSIS — I6602 Occlusion and stenosis of left middle cerebral artery: Secondary | ICD-10-CM

## 2023-07-20 DIAGNOSIS — E785 Hyperlipidemia, unspecified: Secondary | ICD-10-CM

## 2023-07-20 HISTORY — PX: IR CT HEAD LTD: IMG2386

## 2023-07-20 HISTORY — PX: RADIOLOGY WITH ANESTHESIA: SHX6223

## 2023-07-20 HISTORY — PX: IR PERCUTANEOUS ART THROMBECTOMY/INFUSION INTRACRANIAL INC DIAG ANGIO: IMG6087

## 2023-07-20 LAB — BASIC METABOLIC PANEL
Anion gap: 13 (ref 5–15)
Anion gap: 10 (ref 5–15)
BUN: 9 mg/dL (ref 8–23)
BUN: 12 mg/dL (ref 8–23)
CO2: 23 mmol/L (ref 22–32)
CO2: 18 mmol/L — ABNORMAL LOW (ref 22–32)
Calcium: 9.2 mg/dL (ref 8.9–10.3)
Calcium: 8.5 mg/dL — ABNORMAL LOW (ref 8.9–10.3)
Chloride: 103 mmol/L (ref 98–111)
Chloride: 110 mmol/L (ref 98–111)
Creatinine, Ser: 0.57 mg/dL (ref 0.44–1.00)
Creatinine, Ser: 0.83 mg/dL (ref 0.44–1.00)
GFR, Estimated: >60 mL/min (ref >60)
GFR, Estimated: >60 mL/min (ref >60)
Glucose, Bld: 121 mg/dL — ABNORMAL HIGH (ref 70–99)
Glucose, Bld: 186 mg/dL — ABNORMAL HIGH (ref 70–99)
Potassium: 3.2 mmol/L — ABNORMAL LOW (ref 3.5–5.1)
Potassium: 3.7 mmol/L (ref 3.5–5.1)
Sodium: 139 mmol/L (ref 135–145)
Sodium: 138 mmol/L (ref 135–145)

## 2023-07-20 LAB — GLUCOSE, CAPILLARY
Glucose-Capillary: 158 mg/dL — ABNORMAL HIGH (ref 70–99)
Glucose-Capillary: 180 mg/dL — ABNORMAL HIGH (ref 70–99)

## 2023-07-20 LAB — CBC
HCT: 33.2 % — ABNORMAL LOW (ref 36.0–46.0)
Hemoglobin: 11.5 g/dL — ABNORMAL LOW (ref 12.0–15.0)
MCH: 29.3 pg (ref 26.0–34.0)
MCHC: 34.6 g/dL (ref 30.0–36.0)
MCV: 84.5 fL (ref 80.0–100.0)
Platelets: 157 10*3/uL (ref 150–400)
RBC: 3.93 MIL/uL (ref 3.87–5.11)
RDW: 13.2 % (ref 11.5–15.5)
WBC: 6 10*3/uL (ref 4.0–10.5)
nRBC: 0 % (ref 0.0–0.2)

## 2023-07-20 LAB — LIPID PANEL
Cholesterol: 162 mg/dL (ref 0–200)
HDL: 78 mg/dL (ref >40)
LDL Cholesterol: 73 mg/dL (ref 0–99)
Total CHOL/HDL Ratio: 2.1 ratio
Triglycerides: 57 mg/dL (ref <150)
VLDL: 11 mg/dL (ref 0–40)

## 2023-07-20 LAB — MAGNESIUM: Magnesium: 1.6 mg/dL — ABNORMAL LOW (ref 1.7–2.4)

## 2023-07-20 SURGERY — IR WITH ANESTHESIA
Anesthesia: General

## 2023-07-20 MED ORDER — ACETAMINOPHEN 160 MG/5ML PO SOLN: 650.0000 mg | ORAL | Status: DC | PRN

## 2023-07-20 MED ORDER — ORAL CARE MOUTH RINSE: 15.0000 mL | OROMUCOSAL | Status: DC | PRN

## 2023-07-20 MED ORDER — ACETAMINOPHEN 325 MG PO TABS: 650.0000 mg | ORAL_TABLET | ORAL | Status: DC | PRN

## 2023-07-20 MED ORDER — SODIUM CHLORIDE 0.9 % IV SOLN: INTRAVENOUS | Status: AC

## 2023-07-20 MED ORDER — HYDRALAZINE HCL 20 MG/ML IJ SOLN
10.0000 mg | INTRAMUSCULAR | Status: DC | PRN
  Administered 2023-07-21 – 2023-07-22 (×2): 20 mg via INTRAVENOUS
  Filled 2023-07-20 (×4): qty 1

## 2023-07-20 MED ORDER — SUGAMMADEX SODIUM 200 MG/2ML IV SOLN
INTRAVENOUS | Status: DC | PRN
  Administered 2023-07-20: 200 mg via INTRAVENOUS

## 2023-07-20 MED ORDER — ORAL CARE MOUTH RINSE
15.0000 mL | OROMUCOSAL | Status: DC
  Administered 2023-07-20 (×3): 15 mL via OROMUCOSAL

## 2023-07-20 MED ORDER — EPTIFIBATIDE 20 MG/10ML IV SOLN
INTRAVENOUS | Status: AC | PRN
  Administered 2023-07-20 (×2): 1 mL via INTRA_ARTERIAL

## 2023-07-20 MED ORDER — ASPIRIN 81 MG PO CHEW
81.0000 mg | CHEWABLE_TABLET | Freq: Every day | ORAL | Status: DC
  Administered 2023-07-21 – 2023-07-22 (×2): 81 mg
  Filled 2023-07-20 (×2): qty 1

## 2023-07-20 MED ORDER — ATORVASTATIN CALCIUM 10 MG PO TABS: 20.0000 mg | ORAL_TABLET | Freq: Every day | ORAL | Status: DC

## 2023-07-20 MED ORDER — POTASSIUM CHLORIDE 10 MEQ/100ML IV SOLN
10.0000 meq | INTRAVENOUS | Status: AC
  Administered 2023-07-20 (×4): 10 meq via INTRAVENOUS
  Filled 2023-07-20 (×4): qty 100

## 2023-07-20 MED ORDER — EPTIFIBATIDE 20 MG/10ML IV SOLN
INTRAVENOUS | Status: AC
  Filled 2023-07-20: qty 10

## 2023-07-20 MED ORDER — IOHEXOL 300 MG/ML  SOLN
150.0000 mL | Freq: Once | INTRAMUSCULAR | Status: AC | PRN
  Administered 2023-07-20: 80 mL via INTRA_ARTERIAL

## 2023-07-20 MED ORDER — CANGRELOR BOLUS VIA INFUSION
INTRAVENOUS | Status: AC | PRN
  Administered 2023-07-20: 1036.5 ug via INTRAVENOUS

## 2023-07-20 MED ORDER — LACTATED RINGERS IV SOLN: INTRAVENOUS | Status: DC | PRN

## 2023-07-20 MED ORDER — ROCURONIUM BROMIDE 10 MG/ML (PF) SYRINGE
PREFILLED_SYRINGE | INTRAVENOUS | Status: DC | PRN
  Administered 2023-07-20: 40 mg via INTRAVENOUS

## 2023-07-20 MED ORDER — PHENYLEPHRINE 80 MCG/ML (10ML) SYRINGE FOR IV PUSH (FOR BLOOD PRESSURE SUPPORT)
PREFILLED_SYRINGE | INTRAVENOUS | Status: DC | PRN
  Administered 2023-07-20: 80 ug via INTRAVENOUS
  Administered 2023-07-20 (×2): 160 ug via INTRAVENOUS

## 2023-07-20 MED ORDER — FENTANYL CITRATE (PF) 100 MCG/2ML IJ SOLN
INTRAMUSCULAR | Status: DC | PRN
Start: 2023-07-20 — End: 2023-07-20
  Administered 2023-07-20: 100 ug via INTRAVENOUS

## 2023-07-20 MED ORDER — PROPOFOL 1000 MG/100ML IV EMUL
INTRAVENOUS | Status: AC
  Administered 2023-07-20: 60 ug/kg/min
  Filled 2023-07-20: qty 100

## 2023-07-20 MED ORDER — PROPOFOL 1000 MG/100ML IV EMUL
5.0000 ug/kg/min | INTRAVENOUS | Status: DC
  Administered 2023-07-20: 20 ug/kg/min via INTRAVENOUS
  Administered 2023-07-21: 10 ug/kg/min via INTRAVENOUS
  Filled 2023-07-20 (×2): qty 100

## 2023-07-20 MED ORDER — CLEVIDIPINE BUTYRATE 0.5 MG/ML IV EMUL
INTRAVENOUS | Status: AC
  Administered 2023-07-20: 18 mg
  Filled 2023-07-20: qty 100

## 2023-07-20 MED ORDER — ASPIRIN 81 MG PO CHEW: 81.0000 mg | CHEWABLE_TABLET | Freq: Every day | ORAL | Status: DC

## 2023-07-20 MED ORDER — CLOPIDOGREL BISULFATE 75 MG PO TABS
75.0000 mg | ORAL_TABLET | Freq: Every day | ORAL | Status: DC
  Administered 2023-07-21 – 2023-07-22 (×2): 75 mg
  Filled 2023-07-20 (×2): qty 1

## 2023-07-20 MED ORDER — CANGRELOR TETRASODIUM 50 MG IV SOLR
INTRAVENOUS | Status: AC
  Filled 2023-07-20: qty 50

## 2023-07-20 MED ORDER — CLEVIDIPINE BUTYRATE 0.5 MG/ML IV EMUL
INTRAVENOUS | Status: DC | PRN
Start: 2023-07-20 — End: 2023-07-20
  Administered 2023-07-20: 1 mg/h via INTRAVENOUS

## 2023-07-20 MED ORDER — PROPOFOL 10 MG/ML IV BOLUS
INTRAVENOUS | Status: DC | PRN
  Administered 2023-07-20: 150 mg via INTRAVENOUS
  Administered 2023-07-20: 40 mg via INTRAVENOUS
  Administered 2023-07-20: 150 mg via INTRAVENOUS

## 2023-07-20 MED ORDER — IOHEXOL 350 MG/ML SOLN
100.0000 mL | Freq: Once | INTRAVENOUS | Status: AC | PRN
  Administered 2023-07-20: 100 mL via INTRAVENOUS

## 2023-07-20 MED ORDER — LABETALOL HCL 5 MG/ML IV SOLN
10.0000 mg | INTRAVENOUS | Status: DC | PRN
  Administered 2023-07-20 – 2023-07-22 (×4): 20 mg via INTRAVENOUS
  Filled 2023-07-20 (×4): qty 4

## 2023-07-20 MED ORDER — CLEVIDIPINE BUTYRATE 0.5 MG/ML IV EMUL
INTRAVENOUS | Status: AC
  Filled 2023-07-20: qty 50

## 2023-07-20 MED ORDER — PHENYLEPHRINE HCL-NACL 20-0.9 MG/250ML-% IV SOLN
INTRAVENOUS | Status: DC | PRN
  Administered 2023-07-20: 30 ug/min via INTRAVENOUS

## 2023-07-20 MED ORDER — SODIUM CHLORIDE 0.9 % IV SOLN
INTRAVENOUS | Status: AC | PRN
  Administered 2023-07-20: 2 ug/kg/min via INTRAVENOUS

## 2023-07-20 MED ORDER — FENTANYL CITRATE (PF) 100 MCG/2ML IJ SOLN
INTRAMUSCULAR | Status: AC
  Filled 2023-07-20: qty 2

## 2023-07-20 MED ORDER — PROPOFOL 500 MG/50ML IV EMUL
INTRAVENOUS | Status: DC | PRN
Start: 2023-07-20 — End: 2023-07-20
  Administered 2023-07-20: 75 ug/kg/min via INTRAVENOUS

## 2023-07-20 MED ORDER — IOHEXOL 300 MG/ML  SOLN
150.0000 mL | Freq: Once | INTRAMUSCULAR | Status: AC | PRN
  Administered 2023-07-20: 40 mL via INTRA_ARTERIAL

## 2023-07-20 MED ORDER — CLEVIDIPINE BUTYRATE 0.5 MG/ML IV EMUL
0.0000 mg/h | INTRAVENOUS | Status: AC
  Administered 2023-07-20: 21 mg/h via INTRAVENOUS
  Administered 2023-07-20: 16 mg/h via INTRAVENOUS
  Administered 2023-07-20: 10 mg/h via INTRAVENOUS
  Administered 2023-07-20: 21 mg/h via INTRAVENOUS
  Administered 2023-07-21: 12 mg/h via INTRAVENOUS
  Filled 2023-07-20: qty 50
  Filled 2023-07-20 (×6): qty 100

## 2023-07-20 MED ORDER — SUCCINYLCHOLINE CHLORIDE 200 MG/10ML IV SOSY
PREFILLED_SYRINGE | INTRAVENOUS | Status: DC | PRN
Start: 2023-07-20 — End: 2023-07-20
  Administered 2023-07-20: 100 mg via INTRAVENOUS

## 2023-07-20 MED ORDER — ORAL CARE MOUTH RINSE
15.0000 mL | OROMUCOSAL | Status: DC
  Administered 2023-07-20 – 2023-07-22 (×21): 15 mL via OROMUCOSAL

## 2023-07-20 MED ORDER — LIDOCAINE 2% (20 MG/ML) 5 ML SYRINGE
INTRAMUSCULAR | Status: DC | PRN
  Administered 2023-07-20: 40 mg via INTRAVENOUS

## 2023-07-20 MED ORDER — ACETAMINOPHEN 650 MG RE SUPP
650.0000 mg | RECTAL | Status: DC | PRN
Start: 2023-07-20 — End: 2023-07-20

## 2023-07-20 MED ORDER — NITROGLYCERIN 1 MG/10 ML FOR IR/CATH LAB
INTRA_ARTERIAL | Status: AC
  Filled 2023-07-20: qty 10

## 2023-07-20 MED ORDER — CLOPIDOGREL BISULFATE 75 MG PO TABS: 75.0000 mg | ORAL_TABLET | Freq: Every day | ORAL | Status: DC

## 2023-07-20 NOTE — Anesthesia Procedure Notes (Signed)
 Arterial Line Insertion Start/End2/22/2025 4:43 AM, 07/20/2023 4:45 AM Performed by: Flonnie Hailstone, CRNA, CRNA  Patient location: Pre-op. Preanesthetic checklist: patient identified, IV checked, site marked, risks and benefits discussed, surgical consent, monitors and equipment checked and pre-op evaluation Left, radial was placed Catheter size: 20 G Hand hygiene performed  and Seldinger technique used  Attempts: 1 Procedure performed without using ultrasound guided technique. Following insertion, dressing applied and Biopatch. Post procedure assessment: normal  Patient tolerated the procedure well with no immediate complications.

## 2023-07-20 NOTE — Plan of Care (Signed)
  Problem: Education: Goal: Knowledge of disease or condition will improve Outcome: Progressing Goal: Knowledge of secondary prevention will improve (MUST DOCUMENT ALL) Outcome: Progressing   Problem: Ischemic Stroke/TIA Tissue Perfusion: Goal: Complications of ischemic stroke/TIA will be minimized Outcome: Not Progressing   Problem: Coping: Goal: Will verbalize positive feelings about self Outcome: Progressing Goal: Will identify appropriate support needs Outcome: Progressing   Problem: Health Behavior/Discharge Planning: Goal: Ability to manage health-related needs will improve Outcome: Progressing Goal: Goals will be collaboratively established with patient/family Outcome: Progressing   Problem: Self-Care: Goal: Ability to participate in self-care as condition permits will improve Outcome: Progressing Goal: Verbalization of feelings and concerns over difficulty with self-care will improve Outcome: Progressing Goal: Ability to communicate needs accurately will improve Outcome: Progressing   Problem: Nutrition: Goal: Risk of aspiration will decrease Outcome: Progressing Goal: Dietary intake will improve Outcome: Not Progressing   Problem: Education: Goal: Knowledge of General Education information will improve Description: Including pain rating scale, medication(s)/side effects and non-pharmacologic comfort measures Outcome: Progressing   Problem: Health Behavior/Discharge Planning: Goal: Ability to manage health-related needs will improve Outcome: Progressing   Problem: Clinical Measurements: Goal: Ability to maintain clinical measurements within normal limits will improve Outcome: Progressing Goal: Will remain free from infection Outcome: Progressing Goal: Diagnostic test results will improve Outcome: Not Progressing Goal: Respiratory complications will improve Outcome: Progressing Goal: Cardiovascular complication will be avoided Outcome: Progressing    Problem: Activity: Goal: Risk for activity intolerance will decrease Outcome: Not Progressing   Problem: Coping: Goal: Level of anxiety will decrease Outcome: Progressing   Problem: Nutrition: Goal: Adequate nutrition will be maintained Outcome: Not Progressing   Problem: Elimination: Goal: Will not experience complications related to bowel motility Outcome: Progressing Goal: Will not experience complications related to urinary retention Outcome: Progressing   Problem: Pain Managment: Goal: General experience of comfort will improve and/or be controlled Outcome: Progressing   Problem: Safety: Goal: Ability to remain free from injury will improve Outcome: Progressing   Problem: Skin Integrity: Goal: Risk for impaired skin integrity will decrease Outcome: Progressing

## 2023-07-20 NOTE — Transfer of Care (Signed)
 Immediate Anesthesia Transfer of Care Note  Patient: Jillian Oconnor  Procedure(s) Performed: IR WITH ANESTHESIA  Patient Location: ICU  Anesthesia Type:General  Level of Consciousness: sedated and Patient remains intubated per anesthesia plan  Airway & Oxygen Therapy: Patient placed on Ventilator (see vital sign flow sheet for setting)  Post-op Assessment: Report given to RN and Post -op Vital signs reviewed and stable  Post vital signs: Reviewed and stable  Last Vitals:  Vitals Value Taken Time  BP 118/46 07/20/23 0715  Temp    Pulse 69 07/20/23 0716  Resp 17 07/20/23 0716  SpO2 99 % 07/20/23 0716  Vitals shown include unfiled device data.  Last Pain:  Vitals:   07/20/23 0200  TempSrc:   PainSc: 0-No pain         Complications: No notable events documented.

## 2023-07-20 NOTE — Progress Notes (Addendum)
 STROKE TEAM PROGRESS NOTE    SIGNIFICANT HOSPITAL EVENTS 2/21 admitted for acute onset of aphasia and right-sided weakness.  CTA with high-grade stenosis of left M1, moderate proximal M2 stenosis.  CT perfusion no ischemic penumbra 2/22 developed acute global aphasia.  CTA/CTP demonstrated significant area of penumbra.  Went to IR, had an occluded left middle cerebral artery mid M1 segment with extensive atherosclerotic plaque, stent assisted balloon angioplasty with TICI 2C revascularization  INTERIM HISTORY/SUBJECTIVE 2/22-admitted to the ICU Underwent IR for occluded left middle cerebral artery mid M1 segment with extensive atherosclerotic plaque, stent assisted balloon angioplasty with TICI 2C revascularization.  Return to the ICU intubated  Repeat CT head this morning with New hyperdensity within left posterior sulci could represent contrast staining and/or subarachnoid hemorrhage and New hypodensity and loss of gray differentiation in the anterior left frontal lobe is suspicious for acute infarct.   Repeat CT head at 4 PM today  She remains intubated on propofol at 60 mcg and Cleviprex at 20 mg.  As needed labetalol and hydralazine added to maintain BP goal 1 20-1 40  K3.2 will replace   OBJECTIVE  CBC    Component Value Date/Time   WBC 6.0 07/20/2023 0417   RBC 3.93 07/20/2023 0417   HGB 11.5 (L) 07/20/2023 0417   HGB 12.7 04/01/2014 0747   HCT 33.2 (L) 07/20/2023 0417   HCT 37.5 04/01/2014 0747   PLT 157 07/20/2023 0417   PLT 151 04/01/2014 0747   MCV 84.5 07/20/2023 0417   MCV 89 04/01/2014 0747   MCH 29.3 07/20/2023 0417   MCHC 34.6 07/20/2023 0417   RDW 13.2 07/20/2023 0417   RDW 13.8 04/01/2014 0747   LYMPHSABS 1.1 07/19/2023 1755   MONOABS 0.4 07/19/2023 1755   EOSABS 0.1 07/19/2023 1755   BASOSABS 0.0 07/19/2023 1755    BMET    Component Value Date/Time   NA 139 07/20/2023 0417   NA 137 04/01/2014 0747   K 3.2 (L) 07/20/2023 0417   K 3.9 04/01/2014  0747   CL 103 07/20/2023 0417   CL 104 04/01/2014 0747   CO2 23 07/20/2023 0417   CO2 26 04/01/2014 0747   GLUCOSE 121 (H) 07/20/2023 0417   GLUCOSE 128 (H) 04/01/2014 0747   BUN 9 07/20/2023 0417   BUN 10 04/01/2014 0747   CREATININE 0.57 07/20/2023 0417   CREATININE 0.80 04/01/2014 0747   CALCIUM 9.2 07/20/2023 0417   CALCIUM 8.9 04/01/2014 0747   GFRNONAA >60 07/20/2023 0417   GFRNONAA >60 04/01/2014 0747   GFRNONAA >60 05/09/2013 1802    IMAGING past 24 hours CT HEAD WO CONTRAST ( ) Result Date: 07/20/2023 CLINICAL DATA:  Stroke, follow up 4 hours post cangrelor infusion per MD EXAM: CT HEAD WITHOUT CONTRAST TECHNIQUE: Contiguous axial images were obtained from the base of the skull through the vertex without intravenous contrast. RADIATION DOSE REDUCTION: This exam was performed according to the departmental dose-optimization program which includes automated exposure control, adjustment of the mA and/or kV according to patient size and/or use of iterative reconstruction technique. COMPARISON:  CT head July 19, 2023. FINDINGS: Brain: Hyperdensity within left posterior sulci. No significant mass no evidence of acute large vascular territory infarct. New hypodensity and loss of gray differentiation in the anterior left frontal lobe is suspicious for acute infarct. No midline shift. No hydrocephalus. No mass lesion. Vascular: Interval placement of a left MCA stent. Skull: No acute fracture. Sinuses/Orbits: Clear sinuses.  No acute orbital findings. Other: No mastoid  effusions. IMPRESSION: 1. New hyperdensity within left posterior sulci could represent contrast staining and/or subarachnoid hemorrhage. Flat panel CT images from the catheter arteriogram are not available at this time for comparison. Recommend short interval follow-up head CT to assess stability. 2. New hypodensity and loss of gray differentiation in the anterior left frontal lobe is suspicious for acute infarct. MRI could  confirm and further evaluate if clinically warranted. These results will be called to the ordering clinician or representative by the Radiologist Assistant, and communication documented in the PACS or [employer]. Electronically Signed   By: Feliberto Harts M.D.   On: 07/20/2023 10:32   DG CHEST PORT 1 VIEW Result Date: 07/20/2023 CLINICAL DATA:  Code stroke.  Ventilator dependence. EXAM: PORTABLE CHEST 1 VIEW COMPARISON:  05/29/2011 FINDINGS: Endotracheal tube tip is 2.4 cm above the base of the carina. The cardio pericardial silhouette is enlarged. Mild vascular congestion with likely underlying chronic interstitial disease. Large hiatal hernia again noted. No acute bony abnormality. Telemetry leads overlie the chest. IMPRESSION: 1. Endotracheal tube tip is 2.4 cm above the base of the carina. 2. Enlargement of the cardiopericardial silhouette with mild vascular congestion. 3. Large hiatal hernia. Electronically Signed   By: Kennith Center M.D.   On: 07/20/2023 08:35   CT ANGIO HEAD NECK W WO CM W PERF (CODE STROKE) Result Date: 07/20/2023 CLINICAL DATA:  83 year old female with neurologic deficit and punctate scattered infarcts in the left hemisphere on brain MRI yesterday. EXAM: CT ANGIOGRAPHY HEAD AND NECK CT PERFUSION BRAIN TECHNIQUE: Multidetector CT imaging of the head and neck was performed using the standard protocol during bolus administration of intravenous contrast. Multiplanar CT image reconstructions and MIPs were obtained to evaluate the vascular anatomy. Carotid stenosis measurements (when applicable) are obtained utilizing NASCET criteria, using the distal internal carotid diameter as the denominator. Multiphase CT imaging of the brain was performed following IV bolus contrast injection. Subsequent parametric perfusion maps were calculated using RAPID software. RADIATION DOSE REDUCTION: This exam was performed according to the departmental dose-optimization program which includes  automated exposure control, adjustment of the mA and/or kV according to patient size and/or use of iterative reconstruction technique. CONTRAST:  OMNIPAQUE IOHEXOL 350 MG/ML SOLN COMPARISON:  Brain MRI yesterday, CTA head and neck, CTP yesterday. FINDINGS: CT Brain Perfusion Findings: CBF (<30%) Volume: 0mL. Only minimal (4 mL) of deep left MCA territory white matter CBF/CBV abnormality. Perfusion (Tmax>6.0s) volume: 38mL, and now with 100 mL T-max greater than 4 sec in the left MCA territory which is progressed from 41 mL yesterday. Mismatch Volume: 38mL Infarction Location:Left MCA CTA NECK Skeleton: Absent dentition and No acute osseous abnormality identified. Upper chest: Stable, negative. Other neck: Stable, negative. Aortic arch: Stable 3 vessel arch and arch atherosclerosis. Right carotid system: Stable tortuosity and atherosclerosis. Bulky right ICA origin calcified plaque. Stable stenosis numerically estimated at 60 % with respect to the distal vessel. Right ICA remains patent to the skull base. Left carotid system: Stable tortuosity and atherosclerosis. Bulky calcified plaque at the right ICA origin resulting in estimated 65 % stenosis with respect to the distal vessel (series 7, image 93). Left ICA remains patent to the skull base. Vertebral arteries: Dominant right vertebral artery. Stable proximal subclavian atherosclerosis and tortuosity. No significant vertebral artery stenosis in the neck. Stable non dominant and diminutive left vertebral to the skull base. CTA HEAD Posterior circulation: Stable, left vertebral functionally terminates in PICA. Mild right V4 calcified plaque. Patent, somewhat diminutive basilar artery without  stenosis. Fetal type PCA origins. Patent SCA origins. Bilateral PCA branches are patent and stable with mild irregularity. Anterior circulation: Both ICA siphons are patent with calcified atherosclerosis, stable. No hemodynamically significant siphon stenosis. Normal  posterior communicating artery origins. Patent carotid termini, MCA and ACA origins. Bilateral ACA and right MCA branches are stable with no hemodynamically significant stenosis. Ongoing severe left MCA midportion M1 stenosis series 12, image 17. Near occlusion of the vessel there. Distal enhancement with patent left MCA bifurcation. Compared to yesterday there is less robust enhancement of the MCA branches (series 13, image 26), but also CTA timing is earlier now. Venous sinuses: Earlier timing, not well evaluated. Anatomic variants: Dominant right vertebral artery, left terminates in PICA. Fetal type PCA origins peer Review of the MIP images confirms the above findings IMPRESSION: 1. Some progression of abnormal perfusion parameters in the left MCA territory since yesterday. Stable CTA appearance of severe/critical Left MCA M1 stenosis. These results were communicated to Dr. Amada Jupiter at 0429 hours on 07/20/2023 by text page via the [facility] messaging system. 2. Stable CTA Head and neck elsewhere including bulky calcified plaque at both ICA origins with estimated 60-65% bilateral ICA origin stenosis, slightly greater on the left. 3.  Aortic Atherosclerosis (ICD10-I70.0). Electronically Signed   By: Odessa Fleming M.D.   On: 07/20/2023 04:32   MR BRAIN WO CONTRAST Result Date: 07/19/2023 CLINICAL DATA:  Acute neurologic deficit EXAM: MRI HEAD WITHOUT CONTRAST TECHNIQUE: Multiplanar, multiecho pulse sequences of the brain and surrounding structures were obtained without intravenous contrast. COMPARISON:  03/17/2018 FINDINGS: Brain: There are multiple punctate foci of abnormal diffusion restriction within the left frontal white matter and left basal ganglia. No acute or chronic hemorrhage. There is multifocal hyperintense T2-weighted signal within the white matter. Parenchymal volume and CSF spaces are normal. The midline structures are normal. Vascular: Normal flow voids. Skull and upper cervical spine: Normal calvarium  and skull base. Visualized upper cervical spine and soft tissues are normal. Sinuses/Orbits:No paranasal sinus fluid levels or advanced mucosal thickening. No mastoid or middle ear effusion. Normal orbits. IMPRESSION: Multiple punctate acute infarcts within the left frontal white matter and left basal ganglia. No hemorrhage or mass effect. Electronically Signed   By: Deatra Robinson M.D.   On: 07/19/2023 23:13   CT ANGIO HEAD NECK W WO CM W PERF (CODE STROKE) Result Date: 07/19/2023 CLINICAL DATA:  Neuro deficit.  Aphasia.  Right-sided weakness. EXAM: CT ANGIOGRAPHY HEAD AND NECK CT PERFUSION BRAIN TECHNIQUE: Multidetector CT imaging of the head and neck was performed using the standard protocol during bolus administration of intravenous contrast. Multiplanar CT image reconstructions and MIPs were obtained to evaluate the vascular anatomy. Carotid stenosis measurements (when applicable) are obtained utilizing NASCET criteria, using the distal internal carotid diameter as the denominator. Multiphase CT imaging of the brain was performed following IV bolus contrast injection. Subsequent parametric perfusion maps were calculated using RAPID software. RADIATION DOSE REDUCTION: This exam was performed according to the departmental dose-optimization program which includes automated exposure control, adjustment of the mA and/or kV according to patient size and/or use of iterative reconstruction technique. CONTRAST:  OMNIPAQUE IOHEXOL 350 MG/ML SOLN COMPARISON:  CT head without contrast 07/19/2023. MR head without and with contrast 03/17/2018 FINDINGS: CTA NECK FINDINGS Aortic arch: Atherosclerotic calcifications are present at the aortic arch and great vessel origins. No focal stenosis is present. No aneurysm or dissection is present. Right carotid system: The right common carotid artery is within normal limits. Atherosclerotic calcifications are  present at the right carotid bifurcation. The lumen is narrowed to 2  point 4 mm. This compares with a more distal segment of 4.2 mm is below the skull base. Left carotid system: The left common carotid artery is within normal limits. Atherosclerotic calcifications are present at the bifurcation. No significant stenosis of greater than 50% is present relative to the more distal vessel. Vertebral arteries: The right vertebral artery is dominant vessel. Both vertebral arteries originate from the subclavian arteries without significant stenosis. No significant stenosis is present in either vertebral artery in the neck. Skeleton: Multilevel degenerative changes are present in the cervical spine. Uncovertebral spurring and foraminal stenosis is greatest at C4-5 bilaterally. No focal osseous lesions are present. The patient is edentulous. Other neck: Soft tissues the neck are otherwise unremarkable. Salivary glands are within normal limits. Thyroid is normal. No significant adenopathy is present. No focal mucosal or submucosal lesions are present. Upper chest: The lung apices are clear. The thoracic inlet is within normal limits. Review of the MIP images confirms the above findings CTA HEAD FINDINGS Anterior circulation: Atherosclerotic calcifications are present within the cavernous internal carotid arteries bilaterally. No significant stenosis is present through the ICA termini. A high-grade stenosis is present in the mid left M1 segment. The right M1 segment is normal. The A1 segments are normal bilaterally. The anterior communicating artery is patent. Right MCA bifurcation is within limits. Moderate proximal M2 stenoses are present on the left. Asymmetric attenuation of left MCA branch vessels is noted compared to the right. The ACA and right MCA branch vessels are within normal limits. No aneurysm is present. Posterior circulation: The right vertebral artery is dominant. PICA origin normal bilaterally. Left vertebral artery is centrally terminates at the left PICA. A very small distal  V4 segment is present. The basilar artery is small. The superior cerebellar arteries are patent. A fetal type right posterior cerebral artery is present. The left posterior cerebral artery originates from basilar tip. A prominent left posterior communicating artery contributes. A small right P1 segment contributes. The PCA branch vessels are normal bilaterally. No aneurysm is present. Venous sinuses: The dural sinuses are patent. The straight sinus and deep cerebral veins are intact. Cortical veins are within normal limits. 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 Anatomic variants: Fetal type right posterior cerebral artery and prominent left posterior communicating artery. Review of the MIP images confirms the above findings CT Brain Perfusion Findings: ASPECTS: 10/10 CBF (<30%) Volume: 0mL Perfusion (Tmax>6.0s) volume: 0mL Tmax>4.0s is 41 mL in the left MCA territory. Mismatch Volume: 0mL IMPRESSION: 1. 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 2. Moderate proximal M2 stenoses on the left. 3. Asymmetric attenuation of left MCA branch vessels compared to the right. 4. No infarct core on CT perfusion. 5. Although no ischemic penumbra is present utilizing Tmax>6.0s a penumbra is suggested using a shorter time frame caught off. 6. Atherosclerotic changes at the right carotid bifurcation produce a greater than 50% stenosis of the proximal right internal carotid artery. 7. No significant stenosis of the left carotid bifurcation or vertebral arteries in the neck. 8. Multilevel degenerative changes in the cervical spine. Critical Value/emergent results were called by telephone at the time of interpretation on 07/19/2023 at 6:12 pm to provider Dr. Wilford Corner, who verbally acknowledged these results. Electronically Signed   By: Marin Roberts M.D.   On: 07/19/2023 18:32   CT HEAD CODE STROKE WO CONTRAST Result Date: 07/19/2023 CLINICAL DATA:  Code stroke. EXAM: CT HEAD WITHOUT  CONTRAST TECHNIQUE:  Contiguous axial images were obtained from the base of the skull through the vertex without intravenous contrast. RADIATION DOSE REDUCTION: This exam was performed according to the departmental dose-optimization program which includes automated exposure control, adjustment of the mA and/or kV according to patient size and/or use of iterative reconstruction technique. COMPARISON:  MR head without contrast scratched at MR head without and with contrast 03/17/2018 FINDINGS: Brain: Mild atrophy and white matter changes demonstrate some progression since the prior exam. No acute infarct, hemorrhage, or mass lesion is present. The ventricles are of normal size. Deep brain nuclei are within normal limits. Insert normal brainstem Midline structures are within normal limits. Vascular: Mild atherosclerotic calcifications are present within the cavernous internal carotid arteries bilaterally. No hyperdense vessel is present. Skull: Calvarium is intact. No focal lytic or blastic lesions are present. No significant extracranial soft tissue lesion is present. Sinuses/Orbits: The paranasal sinuses and mastoid air cells are clear. The globes and orbits are within normal limits. ASPECTS [facility] Stroke Program Early CT Score) - Ganglionic level infarction (caudate, lentiform nuclei, internal capsule, insula, M1-M3 cortex): 7/7 - Supraganglionic infarction (M4-M6 cortex): 3/3 Total score (0-10 with 10 being normal): 10/10 IMPRESSION: 1. No acute intracranial abnormality or significant interval change. Aspects is 10/10 2. Slight progression of mild atrophy and white matter disease. 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 Electronically Signed   By: Marin Roberts M.D.   On: 07/19/2023 18:04    Vitals:   07/20/23 1134 07/20/23 1157 07/20/23 1200 07/20/23 1217  BP:   (!) 118/54 (!) 117/52  Pulse:  71 77 74  Resp:  17 (!) 22 (!) 23  Temp:   98.6 F (37 C)   TempSrc:   Axillary   SpO2: 98% 97%  97% 97%  Weight:      Height:         PHYSICAL EXAM General: Critically ill intubated and sedated elderly female Psych: Unable to assess CV: Regular rate and rhythm on monitor Respiratory:  Regular, unlabored respirations on ventilator GI: Abdomen soft and nontender   NEURO:  Mental Status: Intubated and sedated.  Eyes are closed, eyes are midline pupils are small and sluggishly reactive.  Does not open eyes to voice or noxious stimuli, does not follow commands  Cranial Nerves:  II: PERRL. Visual fields no blink to threat bilaterally III, IV, VI: EOMI. Eyelids elevate symmetrically.  V: Sensation is intact to light touch and symmetrical to face.  VII: Will to assess due to ET tube VIII: hearing intact to voice. IX, X: Palate elevates symmetrically. Phonation is normal.  XB:[id number] shrug 5/5. XII: tongue is midline without fasciculations. Motor: No movement in upper extremities bilaterally to noxious stimuli, bilateral lowers withdraws to pain Tone: is normal and bulk is normal Sensation-responds to noxious stimuli Coordination: Unable to assess Gait- deferred  Most Recent NIH  1a Level of Conscious.: 2 1b LOC Questions: 2 1c LOC Commands: 2 2 Best Gaze: 0 3 Visual: 3 4 Facial Palsy: 0 5a Motor Arm - left: 4 5b Motor Arm - Right: 4 6a Motor Leg - Left: 3 6b Motor Leg - Right: 3 7 Limb Ataxia: 0 8 Sensory: 2 9 Best Language: 2 10 Dysarthria: 2 11 Extinct. and Inatten.: 0 TOTAL: 29   ASSESSMENT/PLAN  Ms. Jillian Oconnor is a 83 y.o. female with history of hyperlipidemia, anxiety, GERD presented from home  admitted for aphasia and right-sided weakness.  NIH on Admission 7  Acute  Ischemic Infarct:  left MCA s/p IR for occluded left middle cerebral artery mid M1 stent assisted balloon angioplasty with TICI 2C revascularization.  Etiology: Large vessel atherosclerotic disease Code Stroke CT head No acute abnormality. Small vessel disease. Atrophy. ASPECTS  10.    CTA head & neck High-grade stenosis of the mid left M1 segment. Moderate proximal M2 stenoses on the left. Repeat CTA Some progression of abnormal perfusion parameters in the left MCA territory CT perfusion No infarct core on CT perfusion.  Post IR CT focal hyperattenuation in the subarachnoid space overlying the left parietal region representing a mixture of contrast and blood.  MRI  Multiple punctate acute infarcts within the left frontal white matter and left basal ganglia. Repeat CT head- New hyperdensity within left posterior sulci could represent contrast staining and/or subarachnoid hemorrhage.New hypodensity and loss of gray differentiation in the anterior left frontal lobe is suspicious for acute infarct.  Repeat CT head at 1600 today 2D Echo ordered LDL 73 HgbA1c 5.3 VTE prophylaxis -SCDs aspirin 81 mg daily prior to admission, now on aspirin 81 mg daily and clopidogrel 75 mg daily  Therapy recommendations:  Pending Disposition: Pending  Hypertensive Emergency  Home meds: Losartan 50 mg UnStable On Cleviprex at 20 mg As needed labetalol and hydralazine contain BP goal SBP goal 1 20-1 40 for 24 hours then less than 160  Hyperlipidemia Home meds: Atorvastatin 20 mg, resumed in hospital LDL 73, goal < 70 Continue statin at discharge  Acute respiratory failure status post procedure Management per CCM  on propofol drip Protective measures   Dysphagia Patient has post-stroke dysphagia, SLP consulted    Diet   Diet NPO time specified   Advance diet as tolerated  Other Stroke Risk Factors ETOH use, alcohol level <10, advised to drink no more than 1 drink(s) a day  Other Active Problems Hypokalemia-K3.2-replaced-check in the morning  Hospital day # 1   ATTENDING NOTE: I reviewed above note and agree with the assessment and plan. Pt was seen and examined.   This patient is critically ill due to acute L MCA stroke and SAH post thrombectomy and at  significant risk of neurological worsening, death form Brain bleeding or edema. This patient's care requires constant monitoring of vital signs, hemodynamics, respiratory and cardiac monitoring, review of multiple databases, neurological assessment, discussion with family, other specialists and medical decision making of high complexity. I spent 35 minutes of neurocritical care time in the care of this patient. For detailed assessment and plan, please refer to above/below as I have made changes wherever appropriate.   Dineen Kid  Neurology 07/20/2023 5:08 PM      To contact Stroke Continuity provider, please refer to [email or web address]. After hours, contact General Neurology

## 2023-07-20 NOTE — Progress Notes (Signed)
 I was notified at 3:43 AM that the patient on recheck had had significant change in her ability to communicate.  On my arrival, the patient was globally aphasic.  She was taken down for an emergent CTA/CTP which demonstrated a significant area of penumbra that was not present on the initial CTP.  Given progression in both perfusion imaging as well as clinical exam, after discussion with Dr. Corliss Skains, the decision to proceed with formal angiogram with consideration of mechanical intervention was made.  We had been augmenting her pressures to try and support cerebral perfusion, and despite BPs in the 180s, she was significantly worse than earlier.  We discussed with the patient's two daughters the risks and benefits of the procedure including risk of worsening, hemorrhage.  That being said, given the degree of aphasia that she currently exhibits despite maximal medical management, I felt that an attempt at revascularization was warranted.  The daughters agreed to proceed and the patient was taken for angiogram under anesthesia.  NIHSS score: 12 1A: Level of Consciousness - 0 1B: Ask Month and Age - 2 1C: 'Blink Eyes' & 'Squeeze Hands' - 2 2: Test Horizontal Extraocular Movements - 1 3: Test Visual Fields - 0 4: Test Facial Palsy - 1 5A: Test Left Arm Motor Drift - 0 5B: Test Right Arm Motor Drift - 0 6A: Test Left Leg Motor Drift - 0 6B: Test Right Leg Motor Drift - 0 7: Test Limb Ataxia - 0 8: Test Sensation - 1 9: Test Language/Aphasia- 3 10: Test Dysarthria - 2 11: Test Extinction/Inattention - 0   This patient is critically ill and at significant risk of neurological worsening, death and care requires constant monitoring of vital signs, hemodynamics,respiratory and cardiac monitoring, neurological assessment, discussion with family, other specialists and medical decision making of high complexity. I spent 64 minutes of neurocritical care time  in the care of  this patient. This was time  spent independent of any time provided by nurse practitioner or PA.  Ritta Slot, MD [facility]   If 7pm- 7am, please page neurology on call as listed in AMION. 07/20/2023  4:47 AM

## 2023-07-20 NOTE — Progress Notes (Signed)
 Patient transported to 4N32 from CT via the ventilator with no complications. Ventilator oxygen and air hose connected to wall and ventilator was plugged back into a red outlet.

## 2023-07-20 NOTE — Progress Notes (Signed)
 PT Cancellation Note  Patient Details Name: Jillian Oconnor MRN: [id number] DOB: [birth year]   Cancelled Treatment:    Reason Eval/Treat Not Completed: Medical issues which prohibited therapy.  Pt intubated and sedated.  Thanks,  Corinna Capra, PT, DPT  Acute Rehabilitation Secure chat is best for contact #[phone] office      Dimas Aguas 07/20/2023, 8:52 AM

## 2023-07-20 NOTE — Progress Notes (Addendum)
 Noticed patient had enteral meds ordered tomorrow but no access.  OG attempted once but XR showed it was esophageal.  Another order placed, per xray tech request and replaced OGT.  Awaiting rad report.  2130: 2 non-sustained runs of SVT. EKG performed and eLink called to request labs. STAT lab orders received, drawn from art-line, and sent to lab.   2200- eLink notified of 2hr UOP of only 35 mL. Awaiting orders

## 2023-07-20 NOTE — Progress Notes (Signed)
 Pt transported to CT via the ventilator with no complications

## 2023-07-20 NOTE — Progress Notes (Signed)
 eLink Physician-Brief Progress Note Patient Name: Jillian Oconnor DOB: [birth year] MRN: [id number]   Date of Service  07/20/2023  HPI/Events of Note  Intermittent runs of SVT noted  eICU Interventions  Bmp and mg level stat     Intervention Category Intermediate Interventions: Arrhythmia - evaluation and management  Henry Russel, P 07/20/2023, 9:30 PM

## 2023-07-20 NOTE — Consult Note (Signed)
 NAME:  Jillian Oconnor, MRN:  [id number], DOB:  06/27/1940, LOS: 1 ADMISSION DATE:  07/19/2023, CONSULTATION DATE: 07/19/2023 REFERRING MD: Neurohospitalist, CHIEF COMPLAINT: Postprocedural ventilator management  History of Present Illness:  83 year old woman who presented with aphasia outside the tPA window.  She had some initial improvement and there was some question as to the size of penumbra.  As such, intervention was initially deferred.  She then worsened overnight and underwent angioplasty and stenting of a mid M1 lesion.  She was started on cangrelor immediately following angioplasty but postprocedural CT showed evidence of subarachnoid bleeding without mass effect.  Cangrelor was then stopped.  Patient was transferred to the ICU intubated.  A follow-up CT is scheduled for later on this morning.  Pertinent  Medical History   Past Medical History:  Diagnosis Date   Anxiety    GERD (gastroesophageal reflux disease)    Hyperlipidemia   Hypertension Prediabetes   Significant Hospital Events: Including procedures, antibiotic start and stop dates in addition to other pertinent events   2/22- revascularization of occluded left middle cerebral artery M1 segment with stent assisted balloon angioplasty using a 2 mm x 12 mm Onyx frontier balloon minimal stent restoring a TICI 2C revascularization.  2/22 labile hypertension, small area of left parietal subarachnoid hemorrhage.  Interim History / Subjective:  Remains intubated postprocedure.  Objective   Blood pressure (!) 191/80, pulse 69, temperature 98.3 F (36.8 C), temperature source Oral, resp. rate 15, height 4\' 2"  (1.27 m), weight 69.1 kg, SpO2 98%.    Vent Mode: PRVC FiO2 (%):  [40 %] 40 % Set Rate:  [16 bmp] 16 bmp Vt Set:  [400 mL] 400 mL PEEP:  [5 cmH20] 5 cmH20 Plateau Pressure:  [14 cmH20] 14 cmH20   Intake/Output Summary (Last 24 hours) at 07/20/2023 0741 Last data filed at 07/20/2023 1324 Gross per 24 hour   Intake 1887.19 ml  Output 1870 ml  Net 17.19 ml   Filed Weights   07/19/23 1753 07/19/23 1949  Weight: 70.4 kg 69.1 kg    Examination: General: Intubated sedated.  Appears stated age.  Well-nourished. HENT: Orally intubated, OG tube in place. Lungs: Clear to auscultation bilaterally. Cardiovascular: Normal heart sounds no gallops no murmurs.  No peripheral edema.  Extremities warm. Abdomen: Soft. Extremities: Right groin puncture site intact with no ecchymosis. Neuro: With sedation briefly paused, patient does not open eyes or follow commands to voice.  Moves all limbs to painful stimulation. GU: Foley catheter in place clear urine.  Ancillary tests personally reviewed:  Hypokalemia 3.2 Mild anemia 11.5  Assessment & Plan:  Acute left M1 occlusion status post stenting with small postprocedural hemorrhage Cerebral vascular disease Post procedural mechanical ventilation Hypertension Dyslipidemia  Plan:  -Full ventilatory support until repeat CT scan this morning.  If favorable can SBT and potentially extubate. -Maintain systolic blood pressure 120-140.   -Continue propofol for comfort. -Secondary stroke prevention per neurology -Resume dual antiplatelet therapy as instructed by neuro IR.    Best Practice (right click and "Reselect all SmartList Selections" daily)   Diet/type: NPO w/ meds via tube DVT prophylaxis SCD Pressure ulcer(s): N/A GI prophylaxis: H2B Lines: N/A Foley:  Yes, and it is still needed Code Status:  full code Last date of multidisciplinary goals of care discussion [updated at bedside 2/22]    CRITICAL CARE Performed by: Lynnell Catalan   Total critical care time: 40 minutes  Critical care time was exclusive of separately billable procedures and treating other patients.  Critical care was necessary to treat or prevent imminent or life-threatening deterioration.  Critical care was time spent personally by me on the following activities:  development of treatment plan with patient and/or surrogate as well as nursing, discussions with consultants, evaluation of patient's response to treatment, examination of patient, obtaining history from patient or surrogate, ordering and performing treatments and interventions, ordering and review of laboratory studies, ordering and review of radiographic studies, pulse oximetry, re-evaluation of patient's condition and participation in multidisciplinary rounds.  Lynnell Catalan, MD [facility] ICU Physician [facility] [place] [facility]  Pager: [phone] Mobile: [phone] After hours: [phone].

## 2023-07-20 NOTE — Progress Notes (Signed)
Came bedside for echo, but patient is not in room at this time.  

## 2023-07-20 NOTE — Plan of Care (Signed)
  Problem: Ischemic Stroke/TIA Tissue Perfusion: Goal: Complications of ischemic stroke/TIA will be minimized Outcome: Progressing   Problem: Health Behavior/Discharge Planning: Goal: Goals will be collaboratively established with patient/family Outcome: Progressing   Problem: Self-Care: Goal: Ability to participate in self-care as condition permits will improve Outcome: Progressing   Problem: Clinical Measurements: Goal: Ability to maintain clinical measurements within normal limits will improve Outcome: Progressing Goal: Will remain free from infection Outcome: Progressing Goal: Diagnostic test results will improve Outcome: Progressing Goal: Respiratory complications will improve Outcome: Progressing Goal: Cardiovascular complication will be avoided Outcome: Progressing   Problem: Activity: Goal: Risk for activity intolerance will decrease Outcome: Progressing   Problem: Coping: Goal: Level of anxiety will decrease Outcome: Progressing   Problem: Elimination: Goal: Will not experience complications related to urinary retention Outcome: Progressing   Problem: Pain Managment: Goal: General experience of comfort will improve and/or be controlled Outcome: Progressing   Problem: Safety: Goal: Ability to remain free from injury will improve Outcome: Progressing   Problem: Skin Integrity: Goal: Risk for impaired skin integrity will decrease Outcome: Progressing   Problem: Education: Goal: Understanding of CV disease, CV risk reduction, and recovery process will improve Outcome: Progressing Goal: Individualized Educational Video(s) Outcome: Progressing   Problem: Activity: Goal: Ability to return to baseline activity level will improve Outcome: Progressing   Problem: Cardiovascular: Goal: Ability to achieve and maintain adequate cardiovascular perfusion will improve Outcome: Progressing Goal: Vascular access site(s) Level 0-1 will be maintained Outcome:  Progressing   Problem: Health Behavior/Discharge Planning: Goal: Ability to safely manage health-related needs after discharge will improve Outcome: Progressing   Problem: Education: Goal: Knowledge of disease or condition will improve Outcome: Not Progressing Goal: Knowledge of secondary prevention will improve (MUST DOCUMENT ALL) Outcome: Not Progressing Goal: Knowledge of patient specific risk factors will improve (DELETE if not current risk factor) Outcome: Not Progressing   Problem: Coping: Goal: Will verbalize positive feelings about self Outcome: Not Progressing Goal: Will identify appropriate support needs Outcome: Not Progressing   Problem: Health Behavior/Discharge Planning: Goal: Ability to manage health-related needs will improve Outcome: Not Progressing   Problem: Self-Care: Goal: Verbalization of feelings and concerns over difficulty with self-care will improve Outcome: Not Progressing Goal: Ability to communicate needs accurately will improve Outcome: Not Progressing   Problem: Nutrition: Goal: Risk of aspiration will decrease Outcome: Not Progressing Goal: Dietary intake will improve Outcome: Not Progressing   Problem: Education: Goal: Knowledge of General Education information will improve Description: Including pain rating scale, medication(s)/side effects and non-pharmacologic comfort measures Outcome: Not Progressing   Problem: Health Behavior/Discharge Planning: Goal: Ability to manage health-related needs will improve Outcome: Not Progressing   Problem: Nutrition: Goal: Adequate nutrition will be maintained Outcome: Not Progressing   Problem: Elimination: Goal: Will not experience complications related to bowel motility Outcome: Not Progressing

## 2023-07-20 NOTE — Progress Notes (Signed)
 Upon assessment at 3:25am, I noted that patient was sleepier than previous.  Upon several attempts patient was unable to verbalize her name or age as previously able to do so.  Patient able to follow commands in all 4 extremities.   Right facial droop still noted, pupils round equal 3mm bil.   Dr. Amada Jupiter notified at 3:43am with noted changes.  Dr. Amada Jupiter to bedside, stat CT head ordered and patient was taken to IR emergently.

## 2023-07-20 NOTE — Anesthesia Postprocedure Evaluation (Signed)
 Anesthesia Post Note  Patient: Jillian Oconnor  Procedure(s) Performed: IR WITH ANESTHESIA     Patient location during evaluation: ICU Anesthesia Type: General Level of consciousness: sedated and patient remains intubated per anesthesia plan Pain management: pain level controlled Vital Signs Assessment: post-procedure vital signs reviewed and stable Respiratory status: patient on ventilator - see flowsheet for VS and patient remains intubated per anesthesia plan Cardiovascular status: stable Anesthetic complications: no   No notable events documented.  Last Vitals:  Vitals:   07/20/23 0415 07/20/23 0430  BP: (!) 161/82 (!) 191/80  Pulse: 92 92  Resp: (!) 25 (!) 25  Temp:    SpO2: 92% 94%    Last Pain:  Vitals:   07/20/23 0200  TempSrc:   PainSc: 0-No pain                 Flonnie Hailstone

## 2023-07-20 NOTE — Progress Notes (Signed)
 SLP Cancellation Note  Patient Details Name: Jillian Oconnor MRN: [id number] DOB: [birth year]   Cancelled treatment:       Reason Eval/Treat Not Completed: Patient not medically ready. Pt is intubated and sedated. SLP will f/u as time permits.    Gwynneth Aliment, M.A., CF-SLP Speech Language Pathology, Acute Rehabilitation Services  Secure Chat preferred [phone]  07/20/2023, 8:33 AM

## 2023-07-20 NOTE — Anesthesia Procedure Notes (Signed)
 Procedure Name: Intubation Date/Time: 07/20/2023 4:50 AM  Performed by: Flonnie Hailstone, CRNAPre-anesthesia Checklist: Patient identified, Emergency Drugs available, Suction available and Patient being monitored Patient Re-evaluated:Patient Re-evaluated prior to induction Oxygen Delivery Method: Circle system utilized Preoxygenation: Pre-oxygenation with 100% oxygen Induction Type: IV induction, Rapid sequence and Cricoid Pressure applied Laryngoscope Size: Glidescope and 3 (glidego) Grade View: Grade I Tube type: Oral Number of attempts: 1 Airway Equipment and Method: Stylet Placement Confirmation: ETT inserted through vocal cords under direct vision, positive ETCO2 and breath sounds checked- equal and bilateral Secured at: 20 cm Tube secured with: Tape Dental Injury: Teeth and Oropharynx as per pre-operative assessment  Comments: intubated by C. Ottie Tillery, CRNA; ebbs; pt edentulous

## 2023-07-20 NOTE — Progress Notes (Signed)
 Patients daughters in to see patient and introduced to ICU policy and phone number.Marland Kitchen  Updated both Kerby Moors on the plan of care. Family left some clothing and cane at bedside for patient.   Home Meds for patient Omeprazole 40mg  Asprin 81mg  Atorvastatin 20mg  Losartan 50mg  Meloxicam 15mg  Calcitriol . cap Sertraline 50mg   Viramin D3 B-12 Nystatin  cream for 1st finger on left hand

## 2023-07-20 NOTE — Progress Notes (Signed)
 OT Cancellation Note  Patient Details Name: Jillian Oconnor MRN: [id number] DOB: Sep 18, 1940   Cancelled Treatment:     Intubated and sedated--with CT follow up later today with possible extubation dependent on CT scan.   Lindon Romp OT Acute Rehabilitation Services Office [phone]    Evette Georges 07/20/2023, 8:29 AM

## 2023-07-20 NOTE — Anesthesia Preprocedure Evaluation (Addendum)
 Anesthesia Evaluation  Patient identified by MRN, date of birth, ID band Patient awake and Patient unresponsive    Reviewed: Allergy & Precautions, NPO status , Patient's Chart, lab work & pertinent test results, Unable to perform ROS - Chart review onlyPreop documentation limited or incomplete due to emergent nature of procedure.  Airway Mallampati: Unable to assess  TM Distance: <3 FB     Dental  (+) Edentulous Upper, Edentulous Lower   Pulmonary    breath sounds clear to auscultation       Cardiovascular  Rhythm:Regular Rate:Normal  '17 ECHO:  NORMAL LEFT VENTRICULAR SYSTOLIC FUNCTION  NORMAL RIGHT VENTRICULAR SYSTOLIC FUNCTION  MILD VALVULAR REGURGITATION (See above)  NO VALVULAR STENOSIS     Neuro/Psych   Anxiety     Deficits- right sided facial droop, can not cross midline, right sided weakness, and expressive aphasia CVA (acute CVA)    GI/Hepatic ,GERD  ,,  Endo/Other    Renal/GU      Musculoskeletal   Abdominal   Peds  Hematology negative hematology ROS (+)   Anesthesia Other Findings   Reproductive/Obstetrics                              Anesthesia Physical Anesthesia Plan  ASA: 3 and emergent  Anesthesia Plan: General   Post-op Pain Management: Minimal or no pain anticipated   Induction: Intravenous  PONV Risk Score and Plan: 3 and Ondansetron, Dexamethasone and Treatment may vary due to age or medical condition  Airway Management Planned: Oral ETT  Additional Equipment: Arterial line  Intra-op Plan:   Post-operative Plan: Possible Post-op intubation/ventilation  Informed Consent:      Only emergency history available  Plan Discussed with: CRNA and Surgeon  Anesthesia Plan Comments:          Anesthesia Quick Evaluation

## 2023-07-20 NOTE — Procedures (Signed)
 INR.  Status post left common carotid arteriogram.  Right CFA approach.  Findings.  1. Occluded left middle cerebral artery mid M1 segment with extensive atherosclerotic plaque extending into the inferior division.  Status post revascularization of occluded left middle cerebral artery M1 segment with stent assisted balloon angioplasty using a 2 mm x 12 mm Onyx frontier balloon minimal stent restoring a TICI 2C revascularization.    Patient given half bolus dose of cangrelor just prior to the balloon angioplasty and stenting, with a half dose infusion for approximately 45 minutes.  Also given 3 mg of intra-arterial Integrilin.  Final post CT of the brain demonstrates focal hyperattenuation in the subarachnoid space overlying the left parietal region representing a mixture of contrast and blood.  No mass effect noted.  8 French Angio-Seal closure device deployed a of the right groin puncture site.  Distal pulses all present unchanged from prior to procedure.  Patient left intubated due to labile hypertension and  the focal small left parietal subarachnoid hemorrhage.  Follow-up CT brain and 9:57 AM.  Neurohospitalist to be informed.  Fatima Sanger MD.

## 2023-07-20 NOTE — Progress Notes (Signed)
 Patient to IR for emergent Thrombectomy. Dr. Amada Jupiter notified daughters on phone, consent was made to proceed.

## 2023-07-21 ENCOUNTER — Inpatient Hospital Stay (HOSPITAL_COMMUNITY): Payer: Medicare PPO

## 2023-07-21 DIAGNOSIS — R569 Unspecified convulsions: Secondary | ICD-10-CM | POA: Diagnosis not present

## 2023-07-21 DIAGNOSIS — I6602 Occlusion and stenosis of left middle cerebral artery: Secondary | ICD-10-CM

## 2023-07-21 DIAGNOSIS — I609 Nontraumatic subarachnoid hemorrhage, unspecified: Secondary | ICD-10-CM

## 2023-07-21 DIAGNOSIS — E785 Hyperlipidemia, unspecified: Secondary | ICD-10-CM | POA: Diagnosis not present

## 2023-07-21 DIAGNOSIS — I6389 Other cerebral infarction: Secondary | ICD-10-CM

## 2023-07-21 DIAGNOSIS — I10 Essential (primary) hypertension: Secondary | ICD-10-CM | POA: Diagnosis not present

## 2023-07-21 DIAGNOSIS — I63412 Cerebral infarction due to embolism of left middle cerebral artery: Secondary | ICD-10-CM | POA: Diagnosis not present

## 2023-07-21 LAB — CBC WITH DIFFERENTIAL/PLATELET
Abs Immature Granulocytes: 0.03 10*3/uL (ref 0.00–0.07)
Basophils Absolute: 0 10*3/uL (ref 0.0–0.1)
Basophils Relative: 0 %
Eosinophils Absolute: 0 10*3/uL (ref 0.0–0.5)
Eosinophils Relative: 0 %
HCT: 31.6 % — ABNORMAL LOW (ref 36.0–46.0)
Hemoglobin: 10.7 g/dL — ABNORMAL LOW (ref 12.0–15.0)
Immature Granulocytes: 0 %
Lymphocytes Relative: 7 %
Lymphs Abs: 0.8 10*3/uL (ref 0.7–4.0)
MCH: 29.4 pg (ref 26.0–34.0)
MCHC: 33.9 g/dL (ref 30.0–36.0)
MCV: 86.8 fL (ref 80.0–100.0)
Monocytes Absolute: 0.7 10*3/uL (ref 0.1–1.0)
Monocytes Relative: 6 %
Neutro Abs: 9.6 10*3/uL — ABNORMAL HIGH (ref 1.7–7.7)
Neutrophils Relative %: 87 %
Platelets: 175 10*3/uL (ref 150–400)
RBC: 3.64 MIL/uL — ABNORMAL LOW (ref 3.87–5.11)
RDW: 14.4 % (ref 11.5–15.5)
WBC: 11.1 10*3/uL — ABNORMAL HIGH (ref 4.0–10.5)
nRBC: 0 % (ref 0.0–0.2)

## 2023-07-21 LAB — BASIC METABOLIC PANEL
Anion gap: 9 (ref 5–15)
Anion gap: 9 (ref 5–15)
BUN: 13 mg/dL (ref 8–23)
BUN: 22 mg/dL (ref 8–23)
CO2: 18 mmol/L — ABNORMAL LOW (ref 22–32)
CO2: 19 mmol/L — ABNORMAL LOW (ref 22–32)
Calcium: 8.8 mg/dL — ABNORMAL LOW (ref 8.9–10.3)
Calcium: 9 mg/dL (ref 8.9–10.3)
Chloride: 110 mmol/L (ref 98–111)
Chloride: 107 mmol/L (ref 98–111)
Creatinine, Ser: 0.79 mg/dL (ref 0.44–1.00)
Creatinine, Ser: 0.79 mg/dL (ref 0.44–1.00)
GFR, Estimated: >60 mL/min (ref >60)
GFR, Estimated: >60 mL/min (ref >60)
Glucose, Bld: 160 mg/dL — ABNORMAL HIGH (ref 70–99)
Glucose, Bld: 164 mg/dL — ABNORMAL HIGH (ref 70–99)
Potassium: 3.5 mmol/L (ref 3.5–5.1)
Potassium: 4 mmol/L (ref 3.5–5.1)
Sodium: 137 mmol/L (ref 135–145)
Sodium: 135 mmol/L (ref 135–145)

## 2023-07-21 LAB — ECHOCARDIOGRAM COMPLETE
AR max vel: 1.24 cm2
AV Area VTI: 1.36 cm2
AV Area mean vel: 1.27 cm2
AV Mean grad: 14 mmHg
AV Peak grad: 28.5 mmHg
Ao pk vel: 2.67 m/s
Area-P 1/2: 2.45 cm2
Est EF: >75
Height: 62.008 in
S' Lateral: 2.2 cm
Weight: 2437.41 [oz_av]

## 2023-07-21 LAB — POCT I-STAT 7, (LYTES, BLD GAS, ICA,H+H)
Acid-base deficit: 4 mmol/L — ABNORMAL HIGH (ref 0.0–2.0)
Bicarbonate: 20.3 mmol/L (ref 20.0–28.0)
Calcium, Ion: 1.31 mmol/L (ref 1.15–1.40)
HCT: 27 % — ABNORMAL LOW (ref 36.0–46.0)
Hemoglobin: 9.2 g/dL — ABNORMAL LOW (ref 12.0–15.0)
O2 Saturation: 97 %
Patient temperature: 98
Potassium: 4 mmol/L (ref 3.5–5.1)
Sodium: 137 mmol/L (ref 135–145)
TCO2: 21 mmol/L — ABNORMAL LOW (ref 22–32)
pCO2 arterial: 32.9 mmHg (ref 32–48)
pH, Arterial: 7.396 (ref 7.35–7.45)
pO2, Arterial: 90 mmHg (ref 83–108)

## 2023-07-21 LAB — GLUCOSE, CAPILLARY
Glucose-Capillary: 124 mg/dL — ABNORMAL HIGH (ref 70–99)
Glucose-Capillary: 128 mg/dL — ABNORMAL HIGH (ref 70–99)
Glucose-Capillary: 129 mg/dL — ABNORMAL HIGH (ref 70–99)
Glucose-Capillary: 134 mg/dL — ABNORMAL HIGH (ref 70–99)
Glucose-Capillary: 149 mg/dL — ABNORMAL HIGH (ref 70–99)
Glucose-Capillary: 159 mg/dL — ABNORMAL HIGH (ref 70–99)

## 2023-07-21 LAB — MAGNESIUM: Magnesium: 2.6 mg/dL — ABNORMAL HIGH (ref 1.7–2.4)

## 2023-07-21 LAB — TRIGLYCERIDES: Triglycerides: 144 mg/dL (ref <150)

## 2023-07-21 LAB — PHOSPHORUS: Phosphorus: 2.7 mg/dL (ref 2.5–4.6)

## 2023-07-21 MED ORDER — PROSOURCE TF20 ENFIT COMPATIBL EN LIQD
60.0000 mL | Freq: Every day | ENTERAL | Status: DC
  Administered 2023-07-21 – 2023-07-22 (×2): 60 mL
  Filled 2023-07-21 (×3): qty 60

## 2023-07-21 MED ORDER — VITAL HIGH PROTEIN PO LIQD
1000.0000 mL | ORAL | Status: DC
  Administered 2023-07-21: 1000 mL

## 2023-07-21 MED ORDER — DEXMEDETOMIDINE HCL IN NACL 400 MCG/100ML IV SOLN
0.0000 ug/kg/h | INTRAVENOUS | Status: DC
  Administered 2023-07-21 – 2023-07-22 (×2): 0.4 ug/kg/h via INTRAVENOUS
  Filled 2023-07-21 (×2): qty 100

## 2023-07-21 MED ORDER — CLEVIDIPINE BUTYRATE 0.5 MG/ML IV EMUL
0.0000 mg/h | INTRAVENOUS | Status: DC
  Administered 2023-07-21: 16 mg/h via INTRAVENOUS
  Administered 2023-07-21: 12 mg/h via INTRAVENOUS
  Administered 2023-07-21: 16 mg/h via INTRAVENOUS
  Filled 2023-07-21 (×2): qty 50

## 2023-07-21 MED ORDER — ENOXAPARIN SODIUM 40 MG/0.4ML IJ SOSY
40.0000 mg | PREFILLED_SYRINGE | INTRAMUSCULAR | Status: DC
  Administered 2023-07-21: 40 mg via SUBCUTANEOUS
  Filled 2023-07-21: qty 0.4

## 2023-07-21 MED ORDER — MAGNESIUM SULFATE 4 GM/100ML IV SOLN
4.0000 g | Freq: Once | INTRAVENOUS | Status: AC
  Administered 2023-07-21: 4 g via INTRAVENOUS
  Filled 2023-07-21: qty 100

## 2023-07-21 MED ORDER — ATORVASTATIN CALCIUM 10 MG PO TABS
20.0000 mg | ORAL_TABLET | Freq: Every day | ORAL | Status: DC
  Administered 2023-07-21 – 2023-07-22 (×2): 20 mg
  Filled 2023-07-21 (×2): qty 2

## 2023-07-21 MED ORDER — POTASSIUM CHLORIDE 10 MEQ/100ML IV SOLN
10.0000 meq | INTRAVENOUS | Status: AC
  Administered 2023-07-21 (×4): 10 meq via INTRAVENOUS
  Filled 2023-07-21 (×4): qty 100

## 2023-07-21 MED ORDER — CHLORHEXIDINE GLUCONATE CLOTH 2 % EX PADS
6.0000 | MEDICATED_PAD | Freq: Every day | CUTANEOUS | Status: DC
  Administered 2023-07-21: 6 via TOPICAL

## 2023-07-21 MED ORDER — SENNOSIDES-DOCUSATE SODIUM 8.6-50 MG PO TABS: 1.0000 | ORAL_TABLET | Freq: Every evening | ORAL | Status: DC | PRN

## 2023-07-21 NOTE — Procedures (Addendum)
 Patient Name: Jillian Oconnor  MRN: [id number]  Epilepsy Attending: Charlsie Quest  Referring Physician/Provider: Lynnae January, NP  Date: 07/21/2023 Duration: 22.51 mins  Patient history: 83yo F with jerk like movements in the LUE and LLE. EEG to evaluate for seizure  Level of alertness: Awake/ lethargic   AEDs during EEG study: Propofol  Technical aspects: This EEG study was done with scalp electrodes positioned according to the 10-20 International system of electrode placement. Electrical activity was reviewed with band pass filter of 1-70Hz , sensitivity of 7 uV/mm, display speed of 20mm/sec with a 60Hz  notched filter applied as appropriate. EEG data were recorded continuously and digitally stored.  Video monitoring was available and reviewed as appropriate.  Description: EEG showed continuous generalized polymorphic sharply contoured 3 to 6 Hz theta-delta slowing. Generalized periodic discharges with triphasic morphology were noted at 1Hz , predominantly when awake/stimulated  Hyperventilation and photic stimulation were not performed.    Around 1337, patient was noted to have intermittent left leg jerking. Concomitant eeg before, during and after the event didn't show any definite eeg change.    ABNORMALITY - Periodic discharges with triphasic morphology, generalized - Continuous slow, generalized  IMPRESSION: This study is suggestive of moderate diffuse encephalopathy. Additionally there are periodic discharges with triphasic morphology which can be on the ictal-interictal continuum. However, the morphology, frequency of discharges and reactivity to stimulation is more commonly seen due to toxic-metabolic causes.   Around 1337, patient was noted to have intermittent left leg jerking without concomitant eeg change. However, the semiology is concerning for focal motor seizure which may not be seen on scalp EEG. Clinical correlation  is recommended.  Jillian Oconnor

## 2023-07-21 NOTE — Progress Notes (Signed)
 [facility] ADULT ICU REPLACEMENT PROTOCOL FOR AM LAB REPLACEMENT ONLY  The patient does apply for the [facility] Adult ICU Electrolyte Replacment Protocol based on the criteria listed below:   1. Is GFR >/= 40 ml/min? Yes.    Patient's GFR today is 50 2. Is urine output >/= 0.5 ml/kg/hr for the last 6 hours? Yes.   Patient's UOP is 0.5 ml/kg/hr 3. Is BUN < 60 mg/dL? No.  Patient's BUN today is 13 4. Abnormal electrolyte(s): K 3.5, Mg 1.6 5. Ordered repletion with: KCl 66mEq/100ml q1h x 4 and Mg 4gm IV  Christoper Fabian, PharmD, BCPS Please see amion for complete clinical pharmacist phone list 07/21/2023 8:17 AM

## 2023-07-21 NOTE — Progress Notes (Signed)
  Echocardiogram 2D Echocardiogram has been performed.  Delcie Roch 07/21/2023, 10:55 AM

## 2023-07-21 NOTE — Plan of Care (Signed)
  Problem: Health Behavior/Discharge Planning: Goal: Goals will be collaboratively established with patient/family Outcome: Progressing   Problem: Education: Goal: Knowledge of General Education information will improve Description: Including pain rating scale, medication(s)/side effects and non-pharmacologic comfort measures Outcome: Progressing   Problem: Clinical Measurements: Goal: Ability to maintain clinical measurements within normal limits will improve Outcome: Progressing Goal: Will remain free from infection Outcome: Progressing Goal: Diagnostic test results will improve Outcome: Progressing Goal: Respiratory complications will improve Outcome: Progressing Goal: Cardiovascular complication will be avoided Outcome: Progressing   Problem: Activity: Goal: Risk for activity intolerance will decrease Outcome: Progressing   Problem: Coping: Goal: Level of anxiety will decrease Outcome: Progressing   Problem: Pain Managment: Goal: General experience of comfort will improve and/or be controlled Outcome: Progressing   Problem: Safety: Goal: Ability to remain free from injury will improve Outcome: Progressing   Problem: Skin Integrity: Goal: Risk for impaired skin integrity will decrease Outcome: Progressing   Problem: Activity: Goal: Ability to return to baseline activity level will improve Outcome: Progressing   Problem: Cardiovascular: Goal: Ability to achieve and maintain adequate cardiovascular perfusion will improve Outcome: Progressing Goal: Vascular access site(s) Level 0-1 will be maintained Outcome: Progressing   Problem: Health Behavior/Discharge Planning: Goal: Ability to safely manage health-related needs after discharge will improve Outcome: Progressing   Problem: Education: Goal: Knowledge of disease or condition will improve Outcome: Not Progressing Goal: Knowledge of secondary prevention will improve (MUST DOCUMENT ALL) Outcome: Not  Progressing Goal: Knowledge of patient specific risk factors will improve (DELETE if not current risk factor) Outcome: Not Progressing   Problem: Ischemic Stroke/TIA Tissue Perfusion: Goal: Complications of ischemic stroke/TIA will be minimized Outcome: Not Progressing   Problem: Coping: Goal: Will verbalize positive feelings about self Outcome: Not Progressing Goal: Will identify appropriate support needs Outcome: Not Progressing   Problem: Health Behavior/Discharge Planning: Goal: Ability to manage health-related needs will improve Outcome: Not Progressing   Problem: Self-Care: Goal: Ability to participate in self-care as condition permits will improve Outcome: Not Progressing Goal: Verbalization of feelings and concerns over difficulty with self-care will improve Outcome: Not Progressing Goal: Ability to communicate needs accurately will improve Outcome: Not Progressing   Problem: Nutrition: Goal: Risk of aspiration will decrease Outcome: Not Progressing Goal: Dietary intake will improve Outcome: Not Progressing   Problem: Health Behavior/Discharge Planning: Goal: Ability to manage health-related needs will improve Outcome: Not Progressing   Problem: Nutrition: Goal: Adequate nutrition will be maintained Outcome: Not Progressing   Problem: Elimination: Goal: Will not experience complications related to bowel motility Outcome: Not Progressing Goal: Will not experience complications related to urinary retention Outcome: Not Progressing   Problem: Education: Goal: Understanding of CV disease, CV risk reduction, and recovery process will improve Outcome: Not Progressing Goal: Individualized Educational Video(s) Outcome: Not Progressing

## 2023-07-21 NOTE — Progress Notes (Signed)
 SLP Cancellation Note  Patient Details Name: Shaunice Levitan MRN: [id number] DOB: 01-04-1941   Cancelled treatment:        Attempted to see pt for swallowing and speech language assessments. Pt continues to require ventilator support at this time and is not able to participate in evaluation.  SLP will continue to follow for medical readiness.    Kerrie Pleasure, MA, CCC-SLP Acute Rehabilitation Services Office: [phone] 07/21/2023, 10:08 AM

## 2023-07-21 NOTE — Progress Notes (Signed)
 EEG complete - results pending

## 2023-07-21 NOTE — Progress Notes (Signed)
 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 Corliss Skains)  Subjective:  Patient seen in ICU, remains intubated and sedated. No visitors at bedside.   Allergies: Valium [diazepam]  Medications: Prior to Admission medications   Medication Sig Start Date End Date Taking? Authorizing Provider  aspirin EC 81 MG tablet Take 81 mg by mouth daily. Swallow whole.   Yes [provider]  atorvastatin (LIPITOR) 20 MG tablet Take 20 mg by mouth daily.   Yes [provider]  calcitRIOL (ROCALTROL) 0.5 MCG capsule Take 0.5 mcg by mouth daily.   Yes [provider]  Cholecalciferol (D3 PO) Take 1 tablet by mouth daily.   Yes [provider]  Cyanocobalamin (B-12 PO) Take 1 tablet by mouth daily.   Yes [provider]  losartan (COZAAR) 50 MG tablet Take 50 mg by mouth daily.   Yes [provider]  meloxicam (MOBIC) 15 MG tablet Take 15 mg by mouth daily.   Yes [provider]  nystatin cream (MYCOSTATIN) Apply 1 Application topically daily as needed for dry skin.   Yes [provider]  omeprazole (PRILOSEC) 40 MG capsule Take 40 mg by mouth daily. 03/11/15  Yes [provider]  sertraline (ZOLOFT) 50 MG tablet Take 50 mg by mouth daily.   Yes [provider]     Vital Signs: BP (!) 133/91 (BP Location: Right Arm)   Pulse 86   Temp 99.1 F (37.3 C) (Axillary)   Resp (!) 28   Ht 5' 2.01" (1.575 m)   Wt 152 lb 5.4 oz (69.1 kg)   SpO2 99%   BMI 27.86 kg/m   Physical Exam Vitals and nursing note reviewed.  Constitutional:      Comments: Sedated, vent  HENT:     Head: Normocephalic.  Cardiovascular:     Rate and Rhythm: Normal rate and regular rhythm.     Comments: (+) right CFA puncture site clean, dry, dressed  appropriately. Soft, non-pulsatile. No bleeding, significant bruising, erythema, edema or drainage. Patient grimaces with palpation.  Pulmonary:     Comments: Vent Abdominal:     Palpations: Abdomen is soft.  Skin:    General: Skin is warm and dry.  Neurological:     Comments: Withdraws from pain BLE and right groin puncture site, no movement with stimulation of BUE. Does not open eyes or follow commands.     Imaging: DG Abd Portable 1V Result Date: 07/20/2023 CLINICAL DATA:  Orogastric tube placement EXAM: PORTABLE ABDOMEN - 1 VIEW COMPARISON:  CT chest and abdomen 05/09/2013. FINDINGS: Again seen is large hiatal hernia. The tip of an enteric tube is within the mid stomach above the level of the diaphragm within the hernia. No other dilated bowel loops are seen. IMPRESSION: Tip of enteric tube within the mid stomach above the level of the diaphragm within the large hiatal hernia. Electronically Signed   By: Darliss Cheney M.D.   On: 07/20/2023 21:08   DG Abd Portable 1V Result Date: 07/20/2023 CLINICAL DATA:  OG tube placement EXAM: PORTABLE ABDOMEN - 1 VIEW COMPARISON:  CT 05/09/2013 FINDINGS: Large hiatal hernia. OG tube tip is in the hiatal hernia. Nonobstructive bowel gas pattern. IMPRESSION: OG tube in the large hiatal hernia. Electronically Signed   By: Charlett Nose M.D.   On: 07/20/2023 21:07  MR BRAIN WO CONTRAST Result Date: 07/20/2023 CLINICAL DATA:  Stroke follow-up EXAM: MRI HEAD WITHOUT CONTRAST TECHNIQUE: Multiplanar, multiecho pulse sequences of the brain and surrounding structures were obtained without intravenous contrast. COMPARISON:  07/19/2023 FINDINGS: Brain: Multiple new areas of acute ischemia within both hemispheres. The largest area is in the left anterior cerebral artery territory. There are scattered new small foci throughout the left MCA territory. There is a new infarct of the medial right thalamus (PCA territory). There is subarachnoid blood or contrast material  over the left hemisphere. There is multifocal hyperintense T2-weighted signal within the periventricular and deep white matter. No hydrocephalus. Vascular: Normal flow voids. Skull and upper cervical spine: Normal marrow signal. Sinuses/Orbits: Negative. Other: None. IMPRESSION: 1. Multiple new areas of acute ischemia within both hemispheres, the largest area is in the left anterior cerebral artery territory. 2. 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 3. Subarachnoid blood or contrast material over the left hemisphere as shown on earlier head CT. Electronically Signed   By: Deatra Robinson M.D.   On: 07/20/2023 19:16   CT HEAD WO CONTRAST ( ) Result Date: 07/20/2023 CLINICAL DATA:  Stroke, follow up 4 hours post cangrelor infusion per MD EXAM: CT HEAD WITHOUT CONTRAST TECHNIQUE: Contiguous axial images were obtained from the base of the skull through the vertex without intravenous contrast. RADIATION DOSE REDUCTION: This exam was performed according to the departmental dose-optimization program which includes automated exposure control, adjustment of the mA and/or kV according to patient size and/or use of iterative reconstruction technique. COMPARISON:  CT head July 19, 2023. FINDINGS: Brain: Hyperdensity within left posterior sulci. No significant mass no evidence of acute large vascular territory infarct. New hypodensity and loss of gray differentiation in the anterior left frontal lobe is suspicious for acute infarct. No midline shift. No hydrocephalus. No mass lesion. Vascular: Interval placement of a left MCA stent. Skull: No acute fracture. Sinuses/Orbits: Clear sinuses.  No acute orbital findings. Other: No mastoid effusions. IMPRESSION: 1. New hyperdensity within left posterior sulci could represent contrast staining and/or subarachnoid hemorrhage. Flat panel CT images from the catheter arteriogram are not available at this time for comparison. Recommend short interval follow-up  head CT to assess stability. 2. New hypodensity and loss of gray differentiation in the anterior left frontal lobe is suspicious for acute infarct. MRI could confirm and further evaluate if clinically warranted. These results will be called to the ordering clinician or representative by the Radiologist Assistant, and communication documented in the PACS or [employer]. Electronically Signed   By: Feliberto Harts M.D.   On: 07/20/2023 10:32   DG CHEST PORT 1 VIEW Result Date: 07/20/2023 CLINICAL DATA:  Code stroke.  Ventilator dependence. EXAM: PORTABLE CHEST 1 VIEW COMPARISON:  05/29/2011 FINDINGS: Endotracheal tube tip is 2.4 cm above the base of the carina. The cardio pericardial silhouette is enlarged. Mild vascular congestion with likely underlying chronic interstitial disease. Large hiatal hernia again noted. No acute bony abnormality. Telemetry leads overlie the chest. IMPRESSION: 1. Endotracheal tube tip is 2.4 cm above the base of the carina. 2. Enlargement of the cardiopericardial silhouette with mild vascular congestion. 3. Large hiatal hernia. Electronically Signed   By: Kennith Center M.D.   On: 07/20/2023 08:35   CT ANGIO HEAD NECK W WO CM W PERF (CODE STROKE) Result Date: 07/20/2023 CLINICAL DATA:  83 year old female with neurologic deficit and punctate scattered infarcts in the left hemisphere on brain MRI yesterday. EXAM: CT ANGIOGRAPHY HEAD AND NECK  CT PERFUSION BRAIN TECHNIQUE: Multidetector CT imaging of the head and neck was performed using the standard protocol during bolus administration of intravenous contrast. Multiplanar CT image reconstructions and MIPs were obtained to evaluate the vascular anatomy. Carotid stenosis measurements (when applicable) are obtained utilizing NASCET criteria, using the distal internal carotid diameter as the denominator. Multiphase CT imaging of the brain was performed following IV bolus contrast injection. Subsequent parametric perfusion maps were  calculated using RAPID software. RADIATION DOSE REDUCTION: This exam was performed according to the departmental dose-optimization program which includes automated exposure control, adjustment of the mA and/or kV according to patient size and/or use of iterative reconstruction technique. CONTRAST:  OMNIPAQUE IOHEXOL 350 MG/ML SOLN COMPARISON:  Brain MRI yesterday, CTA head and neck, CTP yesterday. FINDINGS: CT Brain Perfusion Findings: CBF (<30%) Volume: 0mL. Only minimal (4 mL) of deep left MCA territory white matter CBF/CBV abnormality. Perfusion (Tmax>6.0s) volume: 38mL, and now with 100 mL T-max greater than 4 sec in the left MCA territory which is progressed from 41 mL yesterday. Mismatch Volume: 38mL Infarction Location:Left MCA CTA NECK Skeleton: Absent dentition and No acute osseous abnormality identified. Upper chest: Stable, negative. Other neck: Stable, negative. Aortic arch: Stable 3 vessel arch and arch atherosclerosis. Right carotid system: Stable tortuosity and atherosclerosis. Bulky right ICA origin calcified plaque. Stable stenosis numerically estimated at 60 % with respect to the distal vessel. Right ICA remains patent to the skull base. Left carotid system: Stable tortuosity and atherosclerosis. Bulky calcified plaque at the right ICA origin resulting in estimated 65 % stenosis with respect to the distal vessel (series 7, image 93). Left ICA remains patent to the skull base. Vertebral arteries: Dominant right vertebral artery. Stable proximal subclavian atherosclerosis and tortuosity. No significant vertebral artery stenosis in the neck. Stable non dominant and diminutive left vertebral to the skull base. CTA HEAD Posterior circulation: Stable, left vertebral functionally terminates in PICA. Mild right V4 calcified plaque. Patent, somewhat diminutive basilar artery without stenosis. Fetal type PCA origins. Patent SCA origins. Bilateral PCA branches are patent and stable with mild  irregularity. Anterior circulation: Both ICA siphons are patent with calcified atherosclerosis, stable. No hemodynamically significant siphon stenosis. Normal posterior communicating artery origins. Patent carotid termini, MCA and ACA origins. Bilateral ACA and right MCA branches are stable with no hemodynamically significant stenosis. Ongoing severe left MCA midportion M1 stenosis series 12, image 17. Near occlusion of the vessel there. Distal enhancement with patent left MCA bifurcation. Compared to yesterday there is less robust enhancement of the MCA branches (series 13, image 26), but also CTA timing is earlier now. Venous sinuses: Earlier timing, not well evaluated. Anatomic variants: Dominant right vertebral artery, left terminates in PICA. Fetal type PCA origins peer Review of the MIP images confirms the above findings IMPRESSION: 1. Some progression of abnormal perfusion parameters in the left MCA territory since yesterday. Stable CTA appearance of severe/critical Left MCA M1 stenosis. These results were communicated to Dr. Amada Jupiter at 0429 hours on 07/20/2023 by text page via the [facility] messaging system. 2. Stable CTA Head and neck elsewhere including bulky calcified plaque at both ICA origins with estimated 60-65% bilateral ICA origin stenosis, slightly greater on the left. 3.  Aortic Atherosclerosis (ICD10-I70.0). Electronically Signed   By: Odessa Fleming M.D.   On: 07/20/2023 04:32   MR BRAIN WO CONTRAST Result Date: 07/19/2023 CLINICAL DATA:  Acute neurologic deficit EXAM: MRI HEAD WITHOUT CONTRAST TECHNIQUE: Multiplanar, multiecho pulse sequences of the brain and surrounding structures were  obtained without intravenous contrast. COMPARISON:  03/17/2018 FINDINGS: Brain: There are multiple punctate foci of abnormal diffusion restriction within the left frontal white matter and left basal ganglia. No acute or chronic hemorrhage. There is multifocal hyperintense T2-weighted signal within the white matter.  Parenchymal volume and CSF spaces are normal. The midline structures are normal. Vascular: Normal flow voids. Skull and upper cervical spine: Normal calvarium and skull base. Visualized upper cervical spine and soft tissues are normal. Sinuses/Orbits:No paranasal sinus fluid levels or advanced mucosal thickening. No mastoid or middle ear effusion. Normal orbits. IMPRESSION: Multiple punctate acute infarcts within the left frontal white matter and left basal ganglia. No hemorrhage or mass effect. Electronically Signed   By: Deatra Robinson M.D.   On: 07/19/2023 23:13   CT ANGIO HEAD NECK W WO CM W PERF (CODE STROKE) Result Date: 07/19/2023 CLINICAL DATA:  Neuro deficit.  Aphasia.  Right-sided weakness. EXAM: CT ANGIOGRAPHY HEAD AND NECK CT PERFUSION BRAIN TECHNIQUE: Multidetector CT imaging of the head and neck was performed using the standard protocol during bolus administration of intravenous contrast. Multiplanar CT image reconstructions and MIPs were obtained to evaluate the vascular anatomy. Carotid stenosis measurements (when applicable) are obtained utilizing NASCET criteria, using the distal internal carotid diameter as the denominator. Multiphase CT imaging of the brain was performed following IV bolus contrast injection. Subsequent parametric perfusion maps were calculated using RAPID software. RADIATION DOSE REDUCTION: This exam was performed according to the departmental dose-optimization program which includes automated exposure control, adjustment of the mA and/or kV according to patient size and/or use of iterative reconstruction technique. CONTRAST:  OMNIPAQUE IOHEXOL 350 MG/ML SOLN COMPARISON:  CT head without contrast 07/19/2023. MR head without and with contrast 03/17/2018 FINDINGS: CTA NECK FINDINGS Aortic arch: Atherosclerotic calcifications are present at the aortic arch and great vessel origins. No focal stenosis is present. No aneurysm or dissection is present. Right carotid system: The  right common carotid artery is within normal limits. Atherosclerotic calcifications are present at the right carotid bifurcation. The lumen is narrowed to 2 point 4 mm. This compares with a more distal segment of 4.2 mm is below the skull base. Left carotid system: The left common carotid artery is within normal limits. Atherosclerotic calcifications are present at the bifurcation. No significant stenosis of greater than 50% is present relative to the more distal vessel. Vertebral arteries: The right vertebral artery is dominant vessel. Both vertebral arteries originate from the subclavian arteries without significant stenosis. No significant stenosis is present in either vertebral artery in the neck. Skeleton: Multilevel degenerative changes are present in the cervical spine. Uncovertebral spurring and foraminal stenosis is greatest at C4-5 bilaterally. No focal osseous lesions are present. The patient is edentulous. Other neck: Soft tissues the neck are otherwise unremarkable. Salivary glands are within normal limits. Thyroid is normal. No significant adenopathy is present. No focal mucosal or submucosal lesions are present. Upper chest: The lung apices are clear. The thoracic inlet is within normal limits. Review of the MIP images confirms the above findings CTA HEAD FINDINGS Anterior circulation: Atherosclerotic calcifications are present within the cavernous internal carotid arteries bilaterally. No significant stenosis is present through the ICA termini. A high-grade stenosis is present in the mid left M1 segment. The right M1 segment is normal. The A1 segments are normal bilaterally. The anterior communicating artery is patent. Right MCA bifurcation is within limits. Moderate proximal M2 stenoses are present on the left. Asymmetric attenuation of left MCA branch vessels is noted compared  to the right. The ACA and right MCA branch vessels are within normal limits. No aneurysm is present. Posterior circulation:  The right vertebral artery is dominant. PICA origin normal bilaterally. Left vertebral artery is centrally terminates at the left PICA. A very small distal V4 segment is present. The basilar artery is small. The superior cerebellar arteries are patent. A fetal type right posterior cerebral artery is present. The left posterior cerebral artery originates from basilar tip. A prominent left posterior communicating artery contributes. A small right P1 segment contributes. The PCA branch vessels are normal bilaterally. No aneurysm is present. Venous sinuses: The dural sinuses are patent. The straight sinus and deep cerebral veins are intact. Cortical veins are within normal limits. 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 Anatomic variants: Fetal type right posterior cerebral artery and prominent left posterior communicating artery. Review of the MIP images confirms the above findings CT Brain Perfusion Findings: ASPECTS: 10/10 CBF (<30%) Volume: 0mL Perfusion (Tmax>6.0s) volume: 0mL Tmax>4.0s is 41 mL in the left MCA territory. Mismatch Volume: 0mL IMPRESSION: 1. 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 2. Moderate proximal M2 stenoses on the left. 3. Asymmetric attenuation of left MCA branch vessels compared to the right. 4. No infarct core on CT perfusion. 5. Although no ischemic penumbra is present utilizing Tmax>6.0s a penumbra is suggested using a shorter time frame caught off. 6. Atherosclerotic changes at the right carotid bifurcation produce a greater than 50% stenosis of the proximal right internal carotid artery. 7. No significant stenosis of the left carotid bifurcation or vertebral arteries in the neck. 8. Multilevel degenerative changes in the cervical spine. Critical Value/emergent results were called by telephone at the time of interpretation on 07/19/2023 at 6:12 pm to provider Dr. Wilford Corner, who verbally acknowledged these results. Electronically Signed   By: Marin Roberts M.D.   On:  07/19/2023 18:32   CT HEAD CODE STROKE WO CONTRAST Result Date: 07/19/2023 CLINICAL DATA:  Code stroke. EXAM: CT HEAD WITHOUT CONTRAST TECHNIQUE: Contiguous axial images were obtained from the base of the skull through the vertex without intravenous contrast. RADIATION DOSE REDUCTION: This exam was performed according to the departmental dose-optimization program which includes automated exposure control, adjustment of the mA and/or kV according to patient size and/or use of iterative reconstruction technique. COMPARISON:  MR head without contrast scratched at MR head without and with contrast 03/17/2018 FINDINGS: Brain: Mild atrophy and white matter changes demonstrate some progression since the prior exam. No acute infarct, hemorrhage, or mass lesion is present. The ventricles are of normal size. Deep brain nuclei are within normal limits. Insert normal brainstem Midline structures are within normal limits. Vascular: Mild atherosclerotic calcifications are present within the cavernous internal carotid arteries bilaterally. No hyperdense vessel is present. Skull: Calvarium is intact. No focal lytic or blastic lesions are present. No significant extracranial soft tissue lesion is present. Sinuses/Orbits: The paranasal sinuses and mastoid air cells are clear. The globes and orbits are within normal limits. ASPECTS [facility] Stroke Program Early CT Score) - Ganglionic level infarction (caudate, lentiform nuclei, internal capsule, insula, M1-M3 cortex): 7/7 - Supraganglionic infarction (M4-M6 cortex): 3/3 Total score (0-10 with 10 being normal): 10/10 IMPRESSION: 1. No acute intracranial abnormality or significant interval change. Aspects is 10/10 2. Slight progression of mild atrophy and white matter disease. 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 Electronically Signed   By: Marin Roberts M.D.   On: 07/19/2023 18:04    Labs:  CBC: Recent Labs  07/19/23 1755  07/19/23 1758 07/20/23 0417 07/21/23 0520  WBC 7.1  --  6.0 11.1*  HGB 12.9 12.2 11.5* 10.7*  HCT 38.2 36.0 33.2* 31.6*  PLT 188  --  157 175    COAGS: Recent Labs    07/19/23 1755  INR 1.0  APTT 24    BMP: Recent Labs    07/19/23 1755 07/19/23 1758 07/20/23 0417 07/20/23 2130 07/21/23 0520  NA 139 139 139 138 137  K 4.0 4.0 3.2* 3.7 3.5  CL 104 104 103 110 110  CO2 25  --  23 18* 18*  GLUCOSE 135* 128* 121* 186* 160*  BUN 17 18 9 12 13   CALCIUM 9.8  --  9.2 8.5* 8.8*  CREATININE 0.80 0.80 0.57 0.83 0.79  GFRNONAA >60  --  >60 >60 >60    LIVER FUNCTION TESTS: Recent Labs    07/19/23 1755  BILITOT 0.4  AST 22  ALT 15  ALKPHOS 46  PROT 6.4*  ALBUMIN 3.9    Assessment and Plan:  83 y/o F admitted 07/19/23 with occluded left MCA mid M1 segment s/p cerebral angiogram with successful revascularization with stent assisted balloon angioplasty 2/22 in NIR (Dr. Corliss Skains). Seen today for post procedure follow up.  Patient remains intubated, sedated. Grimaces with palpation of right groin site, withdraws from pain BLE, no movement with stimulation of BUE. Does not open eyes or follow commands.    Right CFA puncture site unremarkable.  Plan: - Routine wound care of right CFA puncture site until healed. Do not submerge x 7 days, may shower/bed bath now - Continue ASA 81 mg + Plavix 75 mg - NIR will follow, please call with questions or concerns - Plan per neurology/primary team  Electronically Signed: Villa Herb, PA-C 07/21/2023, 9:38 AM   I spent a total of 15 Minutes at the the patient's bedside AND on the patient's hospital floor or unit, greater than 50% of which was counseling/coordinating care for occluded left MCA.

## 2023-07-21 NOTE — Progress Notes (Signed)
 PT Cancellation Note  Patient Details Name: Jillian Oconnor MRN: [id number] DOB: 04-12-1941   Cancelled Treatment:    Reason Eval/Treat Not Completed: Medical issues which prohibited therapy.  Intubated and sedated.  Not ready for PT evaluation.  PT will continue to follow for medical readiness.  Thanks,  Corinna Capra, PT, DPT  Acute Rehabilitation Secure chat is best for contact #[phone] office      Dimas Aguas 07/21/2023, 1:41 PM

## 2023-07-21 NOTE — Consult Note (Signed)
 NAME:  Jillian Oconnor, MRN:  [id number], DOB:  [birth year], LOS: 2 ADMISSION DATE:  07/19/2023, CONSULTATION DATE: 07/19/2023 REFERRING MD: Neurohospitalist, CHIEF COMPLAINT: Postprocedural ventilator management  History of Present Illness:  83 year old woman who presented with aphasia outside the tPA window.  She had some initial improvement and there was some question as to the size of penumbra.  As such, intervention was initially deferred.  She then worsened overnight and underwent angioplasty and stenting of a mid M1 lesion.  She was started on cangrelor immediately following angioplasty but postprocedural CT showed evidence of subarachnoid bleeding without mass effect.  Cangrelor was then stopped.  Patient was transferred to the ICU intubated.  A follow-up CT is scheduled for later on this morning.  Pertinent  Medical History   Past Medical History:  Diagnosis Date   Anxiety    GERD (gastroesophageal reflux disease)    Hyperlipidemia   Hypertension Prediabetes   Significant Hospital Events: Including procedures, antibiotic start and stop dates in addition to other pertinent events   2/22- revascularization of occluded left middle cerebral artery M1 segment with stent assisted balloon angioplasty using a 2 mm x 12 mm Onyx frontier balloon minimal stent restoring a TICI 2C revascularization.  2/22 labile hypertension, small area of left parietal subarachnoid hemorrhage. 2/22 MRI shows fairly large MCA stroke and new PCA stroke.  Hemorrhage is stable.  Interim History / Subjective:  Off sedation she has not really woken up as yet.   Objective   Blood pressure (!) 118/49, pulse 91, temperature 99.1 F (37.3 C), temperature source Axillary, resp. rate (!) 32, height 5' 2.01" (1.575 m), weight 69.1 kg, SpO2 97%.    Vent Mode: PSV FiO2 (%):  [30 %-40 %] 30 % Set Rate:  [16 bmp] 16 bmp Vt Set:  [400 mL] 400 mL PEEP:  [5 cmH20] 5 cmH20 Pressure Support:  [5 cmH20] 5  cmH20 Plateau Pressure:  [12 cmH20-15 cmH20] 15 cmH20   Intake/Output Summary (Last 24 hours) at 07/21/2023 1351 Last data filed at 07/21/2023 1234 Gross per 24 hour  Intake 2236.16 ml  Output 850 ml  Net 1386.16 ml   Filed Weights   07/19/23 1753 07/19/23 1949 07/20/23 0715  Weight: 70.4 kg 69.1 kg 69.1 kg    Examination: General: Intubated, sedation off.  Appears stated age.  Well-nourished. HENT: Orally intubated, OG tube in place. Lungs: Clear to auscultation bilaterally. Tolerating PSV Cardiovascular: Normal heart sounds no gallops no murmurs.  No peripheral edema.  Extremities warm. Abdomen: Soft. Extremities: Right groin puncture site intact with no ecchymosis. Neuro: With sedation off, not opening eyes to command or painful stimulation. Extensor posturing to pain in uppers and flexor in the lowers. Spontaneous toe tapping on the left.  GU: Foley catheter in place clear urine.  Ancillary tests personally reviewed:  Hypokalemia 137 Mild anemia 10.7  Assessment & Plan:  Acute left M1 occlusion status post stenting with small postprocedural hemorrhage Cerebral vascular disease Post procedural mechanical ventilation Hypertension Dyslipidemia  Plan:  -Slow wake up due to residual effects of sedation and bi-hemispheric strokes with pontine involvement.  - Family has stated that patient was clear that she would not want to live dependent on others and had a pre-existing DNR order.  Have changed code status to reflect this. Will continue full scope short of CPR. Dr Teresa Coombs and I have explained that it is still too early to say what kind of recovery she will make. We have proposed to continue support  for a least another 48h and see what she is able to do at that time.   Best Practice (right click and "Reselect all SmartList Selections" daily)   Diet/type: tubefeeds DVT prophylaxis LMWH Pressure ulcer(s): N/A GI prophylaxis: H2B Lines: N/A Foley:  Yes, and it is still  needed Code Status:  full code Last date of multidisciplinary goals of care discussion [updated at bedside 2/23]    CRITICAL CARE Performed by: Lynnell Catalan   Total critical care time: 40 minutes  Critical care time was exclusive of separately billable procedures and treating other patients.  Critical care was necessary to treat or prevent imminent or life-threatening deterioration.  Critical care was time spent personally by me on the following activities: development of treatment plan with patient and/or surrogate as well as nursing, discussions with consultants, evaluation of patient's response to treatment, examination of patient, obtaining history from patient or surrogate, ordering and performing treatments and interventions, ordering and review of laboratory studies, ordering and review of radiographic studies, pulse oximetry, re-evaluation of patient's condition and participation in multidisciplinary rounds.  Lynnell Catalan, MD [facility] ICU Physician [facility]  Pager: [phone] Mobile: [phone] After hours: [phone].

## 2023-07-21 NOTE — Progress Notes (Addendum)
 eLink Physician-Brief Progress Note Patient Name: Karolyna Bianchini DOB: Feb 26, 1941 MRN: [id number]   Date of Service  07/21/2023  HPI/Events of Note  On precedex. Huston Foley down into the 30's for a few seconds x2. Went back into NSR 60-70.   Want to make any adjustments? Or watch for now  Camera evaluation done Art line MAP good. HR 80.  Hypokalemia from AM lab. Mag in evening > 2.    eICU Interventions  discussed, get BMP and call back if K low or co2 going dowwith RNn.      Intervention Category Intermediate Interventions: Arrhythmia - evaluation and management  Ranee Gosselin 07/21/2023, 9:57 PM  22:44 BP 129/36 (55) UOP 90cc since the beginning of the shift.   Follow SBP level. Not DBP. ABG, K level is fine  Continue care

## 2023-07-21 NOTE — Progress Notes (Signed)
 STROKE TEAM PROGRESS NOTE    SIGNIFICANT HOSPITAL EVENTS 2/21 admitted for acute onset of aphasia and right-sided weakness.  CTA with high-grade stenosis of left M1, moderate proximal M2 stenosis.  CT perfusion no ischemic penumbra  2/22 developed acute global aphasia.  CTA/CTP demonstrated significant area of penumbra.  Went to IR, had an occluded left middle cerebral artery mid M1 segment with extensive atherosclerotic plaque, stent assisted balloon angioplasty with TICI 2C revascularization Repeat CT head this morning with New hyperdensity within left posterior sulci could represent contrast staining and/or subarachnoid hemorrhage and New hypodensity and loss of gray differentiation in the anterior left frontal lobe is suspicious for acute infarct.  Repeat MRI head with multiple areas of acute ischemia within both hemispheres, the largest area is in the left anterior cerebral artery territory. New infarct in the right medial thalamus. No significant improvement of her exam when sedation is off.   INTERIM HISTORY/SUBJECTIVE Family at bedside, exam remains unchanged, discussed multiple stroke and current status with family. Advised them to wait another 24 to 48 hours for prognosis.    OBJECTIVE  CBC    Component Value Date/Time   WBC 11.1 (H) 07/21/2023 0520   RBC 3.64 (L) 07/21/2023 0520   HGB 10.7 (L) 07/21/2023 0520   HGB 12.7 04/01/2014 0747   HCT 31.6 (L) 07/21/2023 0520   HCT 37.5 04/01/2014 0747   PLT 175 07/21/2023 0520   PLT 151 04/01/2014 0747   MCV 86.8 07/21/2023 0520   MCV 89 04/01/2014 0747   MCH 29.4 07/21/2023 0520   MCHC 33.9 07/21/2023 0520   RDW 14.4 07/21/2023 0520   RDW 13.8 04/01/2014 0747   LYMPHSABS 0.8 07/21/2023 0520   MONOABS 0.7 07/21/2023 0520   EOSABS 0.0 07/21/2023 0520   BASOSABS 0.0 07/21/2023 0520    BMET    Component Value Date/Time   NA 137 07/21/2023 0520   NA 137 04/01/2014 0747   K 3.5 07/21/2023 0520   K 3.9 04/01/2014 0747   CL  110 07/21/2023 0520   CL 104 04/01/2014 0747   CO2 18 (L) 07/21/2023 0520   CO2 26 04/01/2014 0747   GLUCOSE 160 (H) 07/21/2023 0520   GLUCOSE 128 (H) 04/01/2014 0747   BUN 13 07/21/2023 0520   BUN 10 04/01/2014 0747   CREATININE 0.79 07/21/2023 0520   CREATININE 0.80 04/01/2014 0747   CALCIUM 8.8 (L) 07/21/2023 0520   CALCIUM 8.9 04/01/2014 0747   GFRNONAA >60 07/21/2023 0520   GFRNONAA >60 04/01/2014 0747   GFRNONAA >60 05/09/2013 1802    IMAGING past 24 hours ECHOCARDIOGRAM COMPLETE Result Date: 07/21/2023    ECHOCARDIOGRAM REPORT   Patient Name:   Ophelia DERBOGHOSSIA Schrack Date of Exam: 07/21/2023 Medical Rec #:  [id number]                 Height:       62.0 in Accession #:    [id number]                Weight:       152.3 lb Date of Birth:  June 22, 1940                 BSA:          1.703 m Patient Age:    82 years                  BP:           133/91 mmHg Patient Gender:  F                         HR:           78 bpm. Exam Location:  Inpatient Procedure: 2D Echo (Both Spectral and Color Flow Doppler were utilized during            procedure). Indications:    stroke  History:        Patient has no prior history of Echocardiogram examinations.  Sonographer:    Delcie Roch RDCS Referring Phys: [id number] [facility]  Sonographer Comments: Image acquisition challenging due to patient body habitus. IMPRESSIONS  1. Left ventricular ejection fraction, by estimation, is >75%. The left ventricle has hyperdynamic function. The left ventricle has no regional wall motion abnormalities. There is mild left ventricular hypertrophy. Left ventricular diastolic parameters are consistent with Grade II diastolic dysfunction (pseudonormalization). Elevated left atrial pressure. The E/e' is 20.  2. Right ventricular systolic function is normal. The right ventricular size is normal. There is mildly elevated pulmonary artery systolic pressure. The estimated right ventricular systolic pressure is 42.3 mmHg.  3. The  mitral valve is degenerative. Trivial mitral valve regurgitation. No evidence of mitral stenosis.  4. The aortic valve was not well visualized. Aortic valve regurgitation is not visualized. No aortic stenosis is present.  5. The inferior vena cava is normal in size with <50% respiratory variability, suggesting right atrial pressure of 8 mmHg. Comparison(s): No prior Echocardiogram. Conclusion(s)/Recommendation(s): No intracardiac source of embolism detected on this transthoracic study. Consider a transesophageal echocardiogram to exclude cardiac source of embolism if clinically indicated. FINDINGS  Left Ventricle: Left ventricular ejection fraction, by estimation, is >75%. The left ventricle has hyperdynamic function. The left ventricle has no regional wall motion abnormalities. Strain imaging was not performed. The left ventricular internal cavity size was normal in size. There is mild left ventricular hypertrophy. Left ventricular diastolic parameters are consistent with Grade II diastolic dysfunction (pseudonormalization). Elevated left atrial pressure. The E/e' is 20. Right Ventricle: The right ventricular size is normal. No increase in right ventricular wall thickness. Right ventricular systolic function is normal. There is mildly elevated pulmonary artery systolic pressure. The tricuspid regurgitant velocity is 2.93  m/s, and with an assumed right atrial pressure of 8 mmHg, the estimated right ventricular systolic pressure is 42.3 mmHg. Left Atrium: Left atrial size was normal in size. Right Atrium: Right atrial size was normal in size. Pericardium: There is no evidence of pericardial effusion. Mitral Valve: The mitral valve is degenerative in appearance. Trivial mitral valve regurgitation. No evidence of mitral valve stenosis. Tricuspid Valve: The tricuspid valve is normal in structure. Tricuspid valve regurgitation is mild . No evidence of tricuspid stenosis. Aortic Valve: The aortic valve was not well  visualized. Aortic valve regurgitation is not visualized. No aortic stenosis is present. Aortic valve mean gradient measures 14.0 mmHg. Aortic valve peak gradient measures 28.5 mmHg. Aortic valve area, by VTI measures 1.36 cm. Pulmonic Valve: The pulmonic valve was not well visualized. Pulmonic valve regurgitation is not visualized. No evidence of pulmonic stenosis. Aorta: The aortic root was not well visualized. Venous: IVC assessment for right atrial pressure unable to be performed due to mechanical ventilation. The inferior vena cava is normal in size with less than 50% respiratory variability, suggesting right atrial pressure of 8 mmHg. IAS/Shunts: The atrial septum is grossly normal. Additional Comments: 3D imaging was not performed.  LEFT VENTRICLE PLAX 2D LVIDd:  3.80 cm   Diastology LVIDs:         2.20 cm   LV e' medial:    4.79 cm/s LV PW:         1.10 cm   LV E/e' medial:  23.8 LV IVS:        1.10 cm   LV e' lateral:   6.96 cm/s LVOT diam:     1.70 cm   LV E/e' lateral: 16.4 LV SV:         65 LV SV Index:   38 LVOT Area:     2.27 cm  RIGHT VENTRICLE             IVC RV Basal diam:  2.50 cm     IVC diam: 1.80 cm RV S prime:     13.60 cm/s TAPSE (M-mode): 1.9 cm LEFT ATRIUM             Index        RIGHT ATRIUM           Index LA diam:        3.60 cm 2.11 cm/m   RA Area:     15.70 cm LA Vol (A2C):   56.3 ml 33.06 ml/m  RA Volume:   38.90 ml  22.84 ml/m LA Vol (A4C):   54.6 ml 32.06 ml/m LA Biplane Vol: 57.2 ml 33.59 ml/m  AORTIC VALVE AV Area (Vmax):    1.24 cm AV Area (Vmean):   1.27 cm AV Area (VTI):     1.36 cm AV Vmax:           267.00 cm/s AV Vmean:          175.000 cm/s AV VTI:            0.478 m AV Peak Grad:      28.5 mmHg AV Mean Grad:      14.0 mmHg LVOT Vmax:         145.50 cm/s LVOT Vmean:        97.850 cm/s LVOT VTI:          0.286 m LVOT/AV VTI ratio: 0.60  AORTA Ao Asc diam: 3.30 cm MITRAL VALVE                TRICUSPID VALVE MV Area (PHT): 2.45 cm     TR Peak grad:   34.3  mmHg MV Decel Time: 310 msec     TR Vmax:        293.00 cm/s MV E velocity: 114.00 cm/s MV A velocity: 104.00 cm/s  SHUNTS MV E/A ratio:  1.10         Systemic VTI:  0.29 m                             Systemic Diam: 1.70 cm Sunit Tolia Electronically signed by Tessa Lerner Signature Date/Time: 07/21/2023/11:45:35 AM    Final    DG Abd Portable 1V Result Date: 07/20/2023 CLINICAL DATA:  Orogastric tube placement EXAM: PORTABLE ABDOMEN - 1 VIEW COMPARISON:  CT chest and abdomen 05/09/2013. FINDINGS: Again seen is large hiatal hernia. The tip of an enteric tube is within the mid stomach above the level of the diaphragm within the hernia. No other dilated bowel loops are seen. IMPRESSION: Tip of enteric tube within the mid stomach above the level of the diaphragm within the large hiatal hernia. Electronically Signed   By: Darliss Cheney  M.D.   On: 07/20/2023 21:08   DG Abd Portable 1V Result Date: 07/20/2023 CLINICAL DATA:  OG tube placement EXAM: PORTABLE ABDOMEN - 1 VIEW COMPARISON:  CT 05/09/2013 FINDINGS: Large hiatal hernia. OG tube tip is in the hiatal hernia. Nonobstructive bowel gas pattern. IMPRESSION: OG tube in the large hiatal hernia. Electronically Signed   By: Charlett Nose M.D.   On: 07/20/2023 21:07   MR BRAIN WO CONTRAST Result Date: 07/20/2023 CLINICAL DATA:  Stroke follow-up EXAM: MRI HEAD WITHOUT CONTRAST TECHNIQUE: Multiplanar, multiecho pulse sequences of the brain and surrounding structures were obtained without intravenous contrast. COMPARISON:  07/19/2023 FINDINGS: Brain: Multiple new areas of acute ischemia within both hemispheres. The largest area is in the left anterior cerebral artery territory. There are scattered new small foci throughout the left MCA territory. There is a new infarct of the medial right thalamus (PCA territory). There is subarachnoid blood or contrast material over the left hemisphere. There is multifocal hyperintense T2-weighted signal within the periventricular and  deep white matter. No hydrocephalus. Vascular: Normal flow voids. Skull and upper cervical spine: Normal marrow signal. Sinuses/Orbits: Negative. Other: None. IMPRESSION: 1. Multiple new areas of acute ischemia within both hemispheres, the largest area is in the left anterior cerebral artery territory. 2. 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 3. Subarachnoid blood or contrast material over the left hemisphere as shown on earlier head CT. Electronically Signed   By: Deatra Robinson M.D.   On: 07/20/2023 19:16    Vitals:   07/21/23 1158 07/21/23 1200 07/21/23 1225 07/21/23 1300  BP:  (!) 110/41 (!) 116/53 (!) 118/49  Pulse:  83 91 91  Resp:  (!) 27 (!) 25 (!) 32  Temp:  99.1 F (37.3 C)    TempSrc:  Axillary    SpO2: 98% 99% 98% 97%  Weight:      Height:         PHYSICAL EXAM General: Critically ill intubated and sedated elderly female Psych: Unable to assess CV: Regular rate and rhythm on monitor Respiratory:  Regular, unlabored respirations on ventilator GI: Abdomen soft and nontender   NEURO:  Mental Status: Intubated and sedated.  Eyes are closed, eyes are midline pupils are small and sluggishly reactive.  Does not open eyes to voice or noxious stimuli, does not follow commands  Cranial Nerves:  II: PERRL. Visual fields no blink to threat bilaterally III, IV, VI: EOMI. Eyelids elevate symmetrically.  V: Sensation is intact to light touch and symmetrical to face.  VII: Will to assess due to ET tube VIII: hearing not tested IX, X: Palate elevates symmetrically. Phonation is normal.  EA:[id number] shrug not tested XII: tongue is midline without fasciculations. Motor: Withdraws to pain, same plane of bed in the BUEs and BLEs.  Tone: is normal and bulk is normal Sensation-responds to noxious stimuli Coordination: Unable to assess Gait- deferred  Most Recent NIH  1a Level of Conscious.: 2 1b LOC Questions: 2 1c LOC Commands: 2 2 Best Gaze: 0 3 Visual: 3 4  Facial Palsy: 0 5a Motor Arm - left: 4 5b Motor Arm - Right: 4 6a Motor Leg - Left: 3 6b Motor Leg - Right: 3 7 Limb Ataxia: 0 8 Sensory: 2 9 Best Language: 2 10 Dysarthria: 2 11 Extinct. and Inatten.: 0 TOTAL: 29   ASSESSMENT/PLAN  Jillian Oconnor is a 83 y.o. female with history of hyperlipidemia, anxiety, GERD presented from home  admitted for aphasia and right-sided weakness.  NIH on Admission 7  Acute Ischemic Infarct:  Multiple areas of ischemic stroke in the both hemisphere, largest in the left ACA territory and new right thalamic stroke s/p IR for occluded left middle cerebral artery mid M1 stent assisted balloon angioplasty with TICI 2C revascularization.  Etiology: Large vessel atherosclerotic disease vs. Artery to artery embolization Code Stroke CT head No acute abnormality. Small vessel disease. Atrophy. ASPECTS 10.    CTA head & neck High-grade stenosis of the mid left M1 segment. Moderate proximal M2 stenoses on the left. Repeat CTA Some progression of abnormal perfusion parameters in the left MCA territory CT perfusion No infarct core on CT perfusion.  Post IR CT focal hyperattenuation in the subarachnoid space overlying the left parietal region representing a mixture of contrast and blood.  MRI  Multiple punctate acute infarcts within the left frontal white matter and left basal ganglia. Repeat CT head- New hyperdensity within left posterior sulci could represent contrast staining and/or subarachnoid hemorrhage.New hypodensity and loss of gray differentiation in the anterior left frontal lobe is suspicious for acute infarct.  Repeat MRI Brain: Multiple new areas of acute ischemia within both hemispheres, the largest area is in the left anterior cerebral artery territory. 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 Subarachnoid blood or contrast material over the left hemisphere as shown on earlier head CT 2D Echo EF>75 % LDL 73 HgbA1c  5.3 VTE prophylaxis -SCDs aspirin 81 mg daily prior to admission, now on aspirin 81 mg daily and clopidogrel 75 mg daily  Therapy recommendations:  Pending Disposition: Pending  Hypertensive Emergency  Home meds: Losartan 50 mg UnStable On Cleviprex at 20 mg As needed labetalol and hydralazine contain BP goal SBP goal 1 20-1 40 for 24 hours then less than 160  Hyperlipidemia Home meds: Atorvastatin 20 mg, resumed in hospital LDL 73, goal < 70 Continue statin at discharge  Acute respiratory failure status post procedure Management per CCM  on propofol drip Protective measures   Dysphagia Patient has post-stroke dysphagia, SLP consulted    Diet   Diet NPO time specified   Advance diet as tolerated  Other Stroke Risk Factors ETOH use, alcohol level <10, advised to drink no more than 1 drink(s) a day  Other Active Problems Hypokalemia-K3.2-replaced-check in the morning Jerk like movements in the LUE and LLE, will order a routine EEG   Hospital day # 2    This patient is critically ill due to acute bi hemispheric strokes, right thalamic stroke and SAH post thrombectomy and at significant risk of neurological worsening, death form Brain bleeding or edema or respiratory failure. This patient's care requires constant monitoring of vital signs, hemodynamics, respiratory and cardiac monitoring, review of multiple databases, neurological assessment, discussion with family, other specialists and medical decision making of high complexity. I spent 45 minutes of neurocritical care time in the care of this patient. Discussed with CCM attending Dr. Elizbeth Squires Kaamil Morefield,MD  Neurology 07/21/2023 3:00 PM      To contact Stroke Continuity provider, please refer to [email or web address]. After hours, contact General Neurology

## 2023-07-22 ENCOUNTER — Encounter (HOSPITAL_COMMUNITY): Payer: Self-pay | Admitting: Radiology

## 2023-07-22 DIAGNOSIS — J9601 Acute respiratory failure with hypoxia: Secondary | ICD-10-CM

## 2023-07-22 DIAGNOSIS — I63412 Cerebral infarction due to embolism of left middle cerebral artery: Secondary | ICD-10-CM | POA: Diagnosis not present

## 2023-07-22 DIAGNOSIS — G9341 Metabolic encephalopathy: Secondary | ICD-10-CM | POA: Diagnosis not present

## 2023-07-22 LAB — GLUCOSE, CAPILLARY
Glucose-Capillary: 139 mg/dL — ABNORMAL HIGH (ref 70–99)
Glucose-Capillary: 152 mg/dL — ABNORMAL HIGH (ref 70–99)
Glucose-Capillary: 163 mg/dL — ABNORMAL HIGH (ref 70–99)

## 2023-07-22 LAB — PHOSPHORUS: Phosphorus: 3.2 mg/dL (ref 2.5–4.6)

## 2023-07-22 LAB — MAGNESIUM: Magnesium: 2.4 mg/dL (ref 1.7–2.4)

## 2023-07-22 MED ORDER — MORPHINE 100MG IN NS 100ML (1MG/ML) PREMIX INFUSION
5.0000 mg/h | INTRAVENOUS | Status: DC
  Administered 2023-07-22: 5 mg/h via INTRAVENOUS
  Filled 2023-07-22: qty 100

## 2023-07-22 MED ORDER — PANTOPRAZOLE SODIUM 40 MG IV SOLR: 40.0000 mg | Freq: Every day | INTRAVENOUS | Status: DC

## 2023-07-22 MED ORDER — GLYCOPYRROLATE 0.2 MG/ML IJ SOLN: 0.2000 mg | INTRAMUSCULAR | Status: DC | PRN

## 2023-07-22 MED ORDER — POLYETHYLENE GLYCOL 3350 17 G PO PACK
17.0000 g | PACK | Freq: Every day | ORAL | Status: DC
  Administered 2023-07-22: 17 g
  Filled 2023-07-22: qty 1

## 2023-07-22 MED ORDER — GLYCOPYRROLATE 0.2 MG/ML IJ SOLN
0.2000 mg | INTRAMUSCULAR | Status: DC | PRN
  Administered 2023-07-22: 0.2 mg via INTRAVENOUS
  Filled 2023-07-22: qty 1

## 2023-07-22 MED ORDER — POLYVINYL ALCOHOL 1.4 % OP SOLN: 1.0000 [drp] | Freq: Four times a day (QID) | OPHTHALMIC | Status: DC | PRN

## 2023-07-22 MED ORDER — CLEVIDIPINE BUTYRATE 0.5 MG/ML IV EMUL: 0.0000 mg/h | INTRAVENOUS | Status: DC

## 2023-07-22 MED ORDER — SENNOSIDES-DOCUSATE SODIUM 8.6-50 MG PO TABS
1.0000 | ORAL_TABLET | Freq: Two times a day (BID) | ORAL | Status: DC
  Administered 2023-07-22: 1
  Filled 2023-07-22: qty 1

## 2023-07-22 MED ORDER — GLYCOPYRROLATE 1 MG PO TABS: 1.0000 mg | ORAL_TABLET | ORAL | Status: DC | PRN

## 2023-07-22 MED ORDER — LORAZEPAM 2 MG/ML IJ SOLN
1.0000 mg | INTRAMUSCULAR | Status: DC | PRN
  Administered 2023-07-22: 1 mg via INTRAVENOUS
  Filled 2023-07-22: qty 1

## 2023-07-22 MED ORDER — LEVETIRACETAM 100 MG/ML PO SOLN
500.0000 mg | Freq: Two times a day (BID) | ORAL | Status: DC
  Administered 2023-07-22: 500 mg
  Filled 2023-07-22: qty 5

## 2023-07-27 NOTE — Progress Notes (Addendum)
 NAME:  Jillian Oconnor, MRN:  [id number], DOB:  09/28/1940, LOS: 3 ADMISSION DATE:  07/19/2023, CONSULTATION DATE: 07/19/2023 REFERRING MD: Neurohospitalist, CHIEF COMPLAINT: Postprocedural ventilator management  History of Present Illness:  83 year old woman who presented with aphasia outside the tPA window.  She had some initial improvement and there was some question as to the size of penumbra.  As such, intervention was initially deferred.  She then worsened overnight and underwent angioplasty and stenting of a mid M1 lesion.  She was started on cangrelor immediately following angioplasty but postprocedural CT showed evidence of subarachnoid bleeding without mass effect.  Cangrelor was then stopped.  Patient was transferred to the ICU intubated.  A follow-up CT is scheduled for later on this morning.  Pertinent  Medical History   Past Medical History:  Diagnosis Date   Anxiety    GERD (gastroesophageal reflux disease)    Hyperlipidemia   Hypertension Prediabetes   Significant Hospital Events: Including procedures, antibiotic start and stop dates in addition to other pertinent events   2/22- revascularization of occluded left middle cerebral artery M1 segment with stent assisted balloon angioplasty using a 2 mm x 12 mm Onyx frontier balloon minimal stent restoring a TICI 2C revascularization.  2/22 labile hypertension, small area of left parietal subarachnoid hemorrhage. 2/22 MRI shows fairly large MCA stroke and new PCA stroke.  Hemorrhage is stable. 2/23 weaned for ~7-8 hours but mental status poor. EEG with diffuse encephalopathy. Some concern for focal motor seizure later in the day around 1337  Interim History / Subjective:  Weaned for several hours yesterday but mental status not good enough for extubation.  Objective   Blood pressure 128/64, pulse 66, temperature 99.8 F (37.7 C), temperature source Axillary, resp. rate (!) 29, height 5' 2.01" (1.575 m), weight 71.9  kg, SpO2 99%.    Vent Mode: PRVC FiO2 (%):  [30 %-40 %] 30 % Set Rate:  [16 bmp] 16 bmp Vt Set:  [400 mL] 400 mL PEEP:  [5 cmH20] 5 cmH20 Pressure Support:  [5 cmH20] 5 cmH20 Plateau Pressure:  [17 cmH20] 17 cmH20   Intake/Output Summary (Last 24 hours) at 07/15/2023 0739 Last data filed at 07/15/2023 0700 Gross per 24 hour  Intake 1488.18 ml  Output 745 ml  Net 743.18 ml   Filed Weights   07/19/23 1949 07/20/23 0715 07/08/2023 0500  Weight: 69.1 kg 69.1 kg 71.9 kg    Examination: General: Adult female, in NAD. HENT: Orally intubated, OG tube in place. Lungs: Clear to auscultation bilaterally. Just switched to PSV 5/5. Cardiovascular: RRR, no M/R/G. Abdomen: BS x 4, S/NT/ND. Extremities: No deformities, no edema. Neuro: Does withdraw to pain L > R. Does not follow commands. GU: Foley catheter in place clear urine.  Ancillary tests personally reviewed:  Hypokalemia 137 Mild anemia 10.7  Assessment & Plan:   Acute left M1 occlusion status post stenting with small postprocedural hemorrhage - has been slow wake up due to residual effects of sedation and bi-hemispheric strokes with pontine involvement.  Cerebral vascular disease Acute metabolic encephalopathy 2/2 above/stroke - Neuro following/managing - ASA, Plavix - PT/OT/SLP eventually  Possible seizures? - EEG 2/23 with some concern for focal motor seizure later in the day around 1337. - Will start empiric Keppra for now to prevent further episodes and to avoid contribution to poor mental status. - Neuro to please comment on ongoing AED vs stop.  Post procedural mechanical ventilation - Tolerating PSV ok from respiratory standpoint; however, mental status remains  poor and is a barrier to extubation. - Continue supportive care and ongoing efforts with WUA/SBT. - Bronchial hygiene. - CXR intermittently.  Hypertension Dyslipidemia - Holding PTA Losartan. - PRN Labetalol and Hydralazine for goal SBP < 160. - Continue  statin.  Nutrition. - Place Cortrak order then resume TF's.  Goals of care. - 2/23 Family has stated that patient was clear that she would not want to live dependent on others and had a pre-existing DNR order.  Have changed code status to reflect this. Will continue full scope short of CPR. Dr Teresa Coombs and I have explained that it is still too early to say what kind of recovery she will make. We have proposed to continue support for a least another 48h and see what she is able to do at that time.   Best Practice (right click and "Reselect all SmartList Selections" daily)   Diet/type: tubefeeds DVT prophylaxis LMWH Pressure ulcer(s): N/A GI prophylaxis: PPI Lines: Arterial Line Foley:  Yes, and it is still needed Code Status:  full code Last date of multidisciplinary goals of care discussion [updated at bedside 2/23]   CC time: 35 min.   Rutherford Guys, PA - [facility] For pager details, please see AMION or use Epic chat  After 1900, please call [facility] for cross coverage needs 07/06/2023, 7:58 AM

## 2023-07-27 NOTE — Progress Notes (Signed)
 The chaplain is present for F/U spiritual care and learned of the Pt. death. This chaplain is appreciative of the RN update of the Pt. peaceful death with family at the bedside.  Chaplain Stephanie Acre [phone]

## 2023-07-27 NOTE — Progress Notes (Signed)
 Nutrition Brief Note  Chart reviewed. Pt now transitioning to comfort care.  No further nutrition interventions planned at this time.  Please re-consult as needed.   Cammy Copa., RD, LDN, CNSC See AMiON for contact information

## 2023-07-27 NOTE — Progress Notes (Signed)
 Per family request, because of the patient's baseline anxiety, they asked for an anti-anxiety to be added.  Ativan was ordered by MD.

## 2023-07-27 NOTE — Progress Notes (Signed)
 PT Cancellation Note  Patient Details Name: Jillian Oconnor MRN: [id number] DOB: [birth year]   Cancelled Treatment:    Reason Eval/Treat Not Completed: Patient not medically ready Pt weaning and alert or following commands per RN.  Lillia Pauls, PT, DPT Acute Rehabilitation Services Office [phone]    Norval Morton 07/01/2023, 9:54 AM

## 2023-07-27 NOTE — Progress Notes (Signed)
 STROKE TEAM PROGRESS NOTE    SIGNIFICANT HOSPITAL EVENTS 2/21 admitted for acute onset of aphasia and right-sided weakness.  CTA with high-grade stenosis of left M1, moderate proximal M2 stenosis.  CT perfusion no ischemic penumbra  2/22 developed acute global aphasia.  CTA/CTP demonstrated significant area of penumbra.  Went to IR, had an occluded left middle cerebral artery mid M1 segment with extensive atherosclerotic plaque, stent assisted balloon angioplasty with TICI 2C revascularization Repeat CT head this morning with New hyperdensity within left posterior sulci could represent contrast staining and/or subarachnoid hemorrhage and New hypodensity and loss of gray differentiation in the anterior left frontal lobe is suspicious for acute infarct.  Repeat MRI head with multiple areas of acute ischemia within both hemispheres, the largest area is in the left anterior cerebral artery territory. New infarct in the right medial thalamus. No significant improvement of her exam when sedation is off.   INTERIM HISTORY/SUBJECTIVE Family at bedside.  Patient's daughter and son-in-law.  Over the phone with her other daughter.  They are in tears.  They know she would not want to live like this.  She was very independent and already had her cremation papers filled out as well as her healthcare directives.  Initially the plan was to wait till tomorrow but they are ready to make a decision today.  Lengthy discussion in the room.  I discussed that she may live for few days or even weeks.  They are prepared for that.  After much discussion they were adamant that these were her wishes and they do not want to wait.  Palliative care was consulted and comfort care measures were placed.  OBJECTIVE  CBC    Component Value Date/Time   WBC 11.1 (H) 07/21/2023 0520   RBC 3.64 (L) 07/21/2023 0520   HGB 9.2 (L) 07/21/2023 2215   HGB 12.7 04/01/2014 0747   HCT 27.0 (L) 07/21/2023 2215   HCT 37.5 04/01/2014 0747    PLT 175 07/21/2023 0520   PLT 151 04/01/2014 0747   MCV 86.8 07/21/2023 0520   MCV 89 04/01/2014 0747   MCH 29.4 07/21/2023 0520   MCHC 33.9 07/21/2023 0520   RDW 14.4 07/21/2023 0520   RDW 13.8 04/01/2014 0747   LYMPHSABS 0.8 07/21/2023 0520   MONOABS 0.7 07/21/2023 0520   EOSABS 0.0 07/21/2023 0520   BASOSABS 0.0 07/21/2023 0520    BMET    Component Value Date/Time   NA 137 07/21/2023 2215   NA 137 04/01/2014 0747   K 4.0 07/21/2023 2215   K 3.9 04/01/2014 0747   CL 107 07/21/2023 2202   CL 104 04/01/2014 0747   CO2 19 (L) 07/21/2023 2202   CO2 26 04/01/2014 0747   GLUCOSE 164 (H) 07/21/2023 2202   GLUCOSE 128 (H) 04/01/2014 0747   BUN 22 07/21/2023 2202   BUN 10 04/01/2014 0747   CREATININE 0.79 07/21/2023 2202   CREATININE 0.80 04/01/2014 0747   CALCIUM 9.0 07/21/2023 2202   CALCIUM 8.9 04/01/2014 0747   GFRNONAA >60 07/21/2023 2202   GFRNONAA >60 04/01/2014 0747   GFRNONAA >60 05/09/2013 1802    IMAGING past 24 hours EEG adult Result Date: 07/21/2023 Charlsie Quest, MD     07/21/2023  3:46 PM Patient Name: Jillian Oconnor MRN: [id number] Epilepsy Attending: Charlsie Quest Referring Physician/Provider: Lynnae January, NP Date: 07/21/2023 Duration: 22.51 mins Patient history: 83yo F with jerk like movements in the LUE and LLE. EEG to evaluate for seizure Level  of alertness: Awake/ lethargic AEDs during EEG study: Propofol Technical aspects: This EEG study was done with scalp electrodes positioned according to the 10-20 International system of electrode placement. Electrical activity was reviewed with band pass filter of 1-70Hz , sensitivity of 7 uV/mm, display speed of 29mm/sec with a 60Hz  notched filter applied as appropriate. EEG data were recorded continuously and digitally stored.  Video monitoring was available and reviewed as appropriate. Description: EEG showed continuous generalized polymorphic sharply contoured 3 to 6 Hz theta-delta slowing. Generalized  periodic discharges with triphasic morphology were noted at 1Hz , predominantly when awake/stimulated  Hyperventilation and photic stimulation were not performed.  Around 1337, patient was noted to have intermittent left leg jerking. Concomitant eeg before, during and after the event didn't show any definite eeg change.  ABNORMALITY - Periodic discharges with triphasic morphology, generalized - Continuous slow, generalized IMPRESSION: This study is suggestive of moderate diffuse encephalopathy. Additionally there are periodic discharges with triphasic morphology which can be on the ictal-interictal continuum. However, the morphology, frequency of discharges and reactivity to stimulation is more commonly seen due to toxic-metabolic causes. Around 1337, patient was noted to have intermittent left leg jerking without concomitant eeg change. However, the semiology is concerning for focal motor seizure which may not be seen on scalp EEG. Clinical correlation  is recommended. Charlsie Quest   ECHOCARDIOGRAM COMPLETE Result Date: 07/21/2023    ECHOCARDIOGRAM REPORT   Patient Name:   Jillian Oconnor Date of Exam: 07/21/2023 Medical Rec #:  [id number]                 Height:       62.0 in Accession #:    [id number]                Weight:       152.3 lb Date of Birth:  1940/09/26                 BSA:          1.703 m Patient Age:    82 years                  BP:           133/91 mmHg Patient Gender: F                         HR:           78 bpm. Exam Location:  Inpatient Procedure: 2D Echo (Both Spectral and Color Flow Doppler were utilized during            procedure). Indications:    stroke  History:        Patient has no prior history of Echocardiogram examinations.  Sonographer:    Delcie Roch RDCS Referring Phys: [id number] [facility]  Sonographer Comments: Image acquisition challenging due to patient body habitus. IMPRESSIONS  1. Left ventricular ejection fraction, by estimation, is >75%. The left  ventricle has hyperdynamic function. The left ventricle has no regional wall motion abnormalities. There is mild left ventricular hypertrophy. Left ventricular diastolic parameters are consistent with Grade II diastolic dysfunction (pseudonormalization). Elevated left atrial pressure. The E/e' is 20.  2. Right ventricular systolic function is normal. The right ventricular size is normal. There is mildly elevated pulmonary artery systolic pressure. The estimated right ventricular systolic pressure is 42.3 mmHg.  3. The mitral valve is degenerative. Trivial mitral valve regurgitation. No evidence of mitral stenosis.  4.  The aortic valve was not well visualized. Aortic valve regurgitation is not visualized. No aortic stenosis is present.  5. The inferior vena cava is normal in size with <50% respiratory variability, suggesting right atrial pressure of 8 mmHg. Comparison(s): No prior Echocardiogram. Conclusion(s)/Recommendation(s): No intracardiac source of embolism detected on this transthoracic study. Consider a transesophageal echocardiogram to exclude cardiac source of embolism if clinically indicated. FINDINGS  Left Ventricle: Left ventricular ejection fraction, by estimation, is >75%. The left ventricle has hyperdynamic function. The left ventricle has no regional wall motion abnormalities. Strain imaging was not performed. The left ventricular internal cavity size was normal in size. There is mild left ventricular hypertrophy. Left ventricular diastolic parameters are consistent with Grade II diastolic dysfunction (pseudonormalization). Elevated left atrial pressure. The E/e' is 20. Right Ventricle: The right ventricular size is normal. No increase in right ventricular wall thickness. Right ventricular systolic function is normal. There is mildly elevated pulmonary artery systolic pressure. The tricuspid regurgitant velocity is 2.93  m/s, and with an assumed right atrial pressure of 8 mmHg, the estimated right  ventricular systolic pressure is 42.3 mmHg. Left Atrium: Left atrial size was normal in size. Right Atrium: Right atrial size was normal in size. Pericardium: There is no evidence of pericardial effusion. Mitral Valve: The mitral valve is degenerative in appearance. Trivial mitral valve regurgitation. No evidence of mitral valve stenosis. Tricuspid Valve: The tricuspid valve is normal in structure. Tricuspid valve regurgitation is mild . No evidence of tricuspid stenosis. Aortic Valve: The aortic valve was not well visualized. Aortic valve regurgitation is not visualized. No aortic stenosis is present. Aortic valve mean gradient measures 14.0 mmHg. Aortic valve peak gradient measures 28.5 mmHg. Aortic valve area, by VTI measures 1.36 cm. Pulmonic Valve: The pulmonic valve was not well visualized. Pulmonic valve regurgitation is not visualized. No evidence of pulmonic stenosis. Aorta: The aortic root was not well visualized. Venous: IVC assessment for right atrial pressure unable to be performed due to mechanical ventilation. The inferior vena cava is normal in size with less than 50% respiratory variability, suggesting right atrial pressure of 8 mmHg. IAS/Shunts: The atrial septum is grossly normal. Additional Comments: 3D imaging was not performed.  LEFT VENTRICLE PLAX 2D LVIDd:         3.80 cm   Diastology LVIDs:         2.20 cm   LV e' medial:    4.79 cm/s LV PW:         1.10 cm   LV E/e' medial:  23.8 LV IVS:        1.10 cm   LV e' lateral:   6.96 cm/s LVOT diam:     1.70 cm   LV E/e' lateral: 16.4 LV SV:         65 LV SV Index:   38 LVOT Area:     2.27 cm  RIGHT VENTRICLE             IVC RV Basal diam:  2.50 cm     IVC diam: 1.80 cm RV S prime:     13.60 cm/s TAPSE (M-mode): 1.9 cm LEFT ATRIUM             Index        RIGHT ATRIUM           Index LA diam:        3.60 cm 2.11 cm/m   RA Area:     15.70 cm LA Vol (A2C):  56.3 ml 33.06 ml/m  RA Volume:   38.90 ml  22.84 ml/m LA Vol (A4C):   54.6 ml 32.06  ml/m LA Biplane Vol: 57.2 ml 33.59 ml/m  AORTIC VALVE AV Area (Vmax):    1.24 cm AV Area (Vmean):   1.27 cm AV Area (VTI):     1.36 cm AV Vmax:           267.00 cm/s AV Vmean:          175.000 cm/s AV VTI:            0.478 m AV Peak Grad:      28.5 mmHg AV Mean Grad:      14.0 mmHg LVOT Vmax:         145.50 cm/s LVOT Vmean:        97.850 cm/s LVOT VTI:          0.286 m LVOT/AV VTI ratio: 0.60  AORTA Ao Asc diam: 3.30 cm MITRAL VALVE                TRICUSPID VALVE MV Area (PHT): 2.45 cm     TR Peak grad:   34.3 mmHg MV Decel Time: 310 msec     TR Vmax:        293.00 cm/s MV E velocity: 114.00 cm/s MV A velocity: 104.00 cm/s  SHUNTS MV E/A ratio:  1.10         Systemic VTI:  0.29 m                             Systemic Diam: 1.70 cm Sunit Tolia Electronically signed by Tessa Lerner Signature Date/Time: 07/21/2023/11:45:35 AM    Final     Vitals:   06/29/2023 0737 07/21/2023 0800 07/11/2023 0900 07/07/2023 0906  BP: 128/64 (!) 106/42 (!) 122/55   Pulse: 66 63 76 71  Resp: (!) 31 (!) 27 (!) 26 (!) 31  Temp:  98.6 F (37 C)    TempSrc:  Axillary    SpO2: 98% 98% 99% 99%  Weight:      Height:         PHYSICAL EXAM General: Critically ill intubated and sedated elderly female Psych: Unable to assess CV: Regular rate and rhythm on monitor Respiratory:  Regular, unlabored respirations on ventilator   NEURO:  Mental Status: Intubated and unresponsive.  Only on Precedex for agitation which was stopped.  Eyes are closed, eyes are midline pupils are small and sluggishly reactive.  Does not open eyes to voice or noxious stimuli, does not follow commands  Cranial Nerves:  II: PERRL. Visual fields no blink to threat bilaterally III, IV, VI: EOMI. Eyelids elevate symmetrically.  V: Sensation is intact to light touch and symmetrical to face.  VII: Will to assess due to ET tube VIII: hearing not tested IX, X: Palate elevates symmetrically. Phonation is normal.  OF:BPZWCHEN shrug not tested XII: tongue is  midline without fasciculations. Motor: Withdraws to pain, same plane of bed in the BUEs and BLEs.  Tone: is normal and bulk is normal Sensation-responds to noxious stimuli Coordination: Unable to assess Gait- deferred  Most Recent NIH  1a Level of Conscious.: 2 1b LOC Questions: 2 1c LOC Commands: 2 2 Best Gaze: 0 3 Visual: 3 4 Facial Palsy: 0 5a Motor Arm - left: 4 5b Motor Arm - Right: 4 6a Motor Leg - Left: 3 6b Motor Leg - Right: 3 7 Limb Ataxia:  0 8 Sensory: 2 9 Best Language: 2 10 Dysarthria: 2 11 Extinct. and Inatten.: 0 TOTAL: 29   ASSESSMENT/PLAN  Jillian Oconnor is a 83 y.o. female with history of hyperlipidemia, anxiety, GERD presented from home  admitted for aphasia and right-sided weakness.  NIH on Admission 7  Acute Ischemic Infarct:  Multiple areas of ischemic stroke in the both hemisphere, largest in the left ACA territory and new right thalamic stroke s/p IR for occluded left middle cerebral artery mid M1 stent assisted balloon angioplasty with TICI 2C revascularization.  Etiology: Large vessel atherosclerotic disease vs. Artery to artery embolization Code Stroke CT head No acute abnormality. Small vessel disease. Atrophy. ASPECTS 10.    CTA head & neck High-grade stenosis of the mid left M1 segment. Moderate proximal M2 stenoses on the left. Repeat CTA Some progression of abnormal perfusion parameters in the left MCA territory CT perfusion No infarct core on CT perfusion.  Post IR CT focal hyperattenuation in the subarachnoid space overlying the left parietal region representing a mixture of contrast and blood.  MRI  Multiple punctate acute infarcts within the left frontal white matter and left basal ganglia. Repeat CT head- New hyperdensity within left posterior sulci could represent contrast staining and/or subarachnoid hemorrhage.New hypodensity and loss of gray differentiation in the anterior left frontal lobe is suspicious for acute infarct.   Repeat MRI Brain: Multiple new areas of acute ischemia within both hemispheres, the largest area is in the left anterior cerebral artery territory. 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 Subarachnoid blood or contrast material over the left hemisphere as shown on earlier head CT 2D Echo EF>75 % LDL 73 HgbA1c 5.3 VTE prophylaxis -SCDs aspirin 81 mg daily prior to admission, now on aspirin 81 mg daily and clopidogrel 75 mg daily  Therapy recommendations:  Pending Disposition: Pending  Hypertensive Emergency  Home meds: Losartan 50 mg UnStable On Cleviprex at 20 mg As needed labetalol and hydralazine contain BP goal SBP goal 1 20-1 40 for 24 hours then less than 160  Hyperlipidemia Home meds: Atorvastatin 20 mg, resumed in hospital LDL 73, goal < 70 Continue statin at discharge  Acute respiratory failure status post procedure Management per CCM  on propofol drip Protective measures   Dysphagia Patient has post-stroke dysphagia, SLP consulted    Diet   Diet NPO time specified   Advance diet as tolerated  Other Stroke Risk Factors ETOH use, alcohol level <10, advised to drink no more than 1 drink(s) a day  Other Active Problems Hypokalemia-K3.2-replaced-check in the morning Jerk like movements in the LUE and LLE, will order a routine EEG   Hospital day # 3  Lengthy discussion as noted above.  She is a DNR family decided to move to comfort care measures.  Chaplain services were offered as well as palliative care consult.  Patient passed quickly after 1 week terminal extubation.   This patient is critically ill due to respiratory distress,  bihemispheric stroke and SAH post thrombectomy and at significant risk of neurological worsening, death form heart failure, respiratory failure, recurrent stroke, bleeding from [facility], seizure, sepsis. This patient's care requires constant monitoring of vital signs, hemodynamics, respiratory and cardiac monitoring, review  of multiple databases, neurological assessment, discussion with family, other specialists and medical decision making of high complexity. I spent 35 minutes of neurocritical care time in the care of this patient.   Souleymane Saiki,MD    To contact Stroke Continuity provider, please refer to  [email or web address]. After hours, contact General Neurology

## 2023-07-27 NOTE — Progress Notes (Signed)
 SLP Cancellation Note  Patient Details Name: Jillian Oconnor MRN: [id number] DOB: [birth year]   Cancelled treatment:       Reason Eval/Treat Not Completed: Patient not medically ready. Pt still on vent and per chart, not yet ready for extubation due to mentation. Will f/u as able.    Mahala Menghini., M.A. CCC-SLP Acute Rehabilitation Services Office [phone]  Secure chat preferred  07/16/2023, 8:04 AM

## 2023-07-27 NOTE — Progress Notes (Signed)
 PT was compassionately extubated to room air with RN in the room.  Family made aware by RN that extubation was taking place.

## 2023-07-27 NOTE — Plan of Care (Signed)
 Problem: Health Behavior/Discharge Planning: Goal: Goals will be collaboratively established with patient/family 07/06/2023 1333 by Barnett Hatter, RN Outcome: Progressing 07/09/2023 1328 by Barnett Hatter, RN Outcome: Progressing   Problem: Elimination: Goal: Will not experience complications related to urinary retention 07/05/2023 1333 by Barnett Hatter, RN Outcome: Progressing 07/01/2023 1328 by Barnett Hatter, RN Outcome: Progressing   Problem: Pain Managment: Goal: General experience of comfort will improve and/or be controlled 07/03/2023 1333 by Barnett Hatter, RN Outcome: Progressing 07/09/2023 1328 by Barnett Hatter, RN Outcome: Progressing   Problem: Safety: Goal: Ability to remain free from injury will improve 07/14/2023 1333 by Barnett Hatter, RN Outcome: Progressing 07/24/2023 1328 by Barnett Hatter, RN Outcome: Progressing   Problem: Skin Integrity: Goal: Risk for impaired skin integrity will decrease 07/13/2023 1333 by Barnett Hatter, RN Outcome: Progressing 07/02/2023 1328 by Barnett Hatter, RN Outcome: Progressing   Problem: Cardiovascular: Goal: Ability to achieve and maintain adequate cardiovascular perfusion will improve 07/05/2023 1333 by Barnett Hatter, RN Outcome: Progressing 07/24/2023 1328 by Barnett Hatter, RN Outcome: Progressing Goal: Vascular access site(s) Level 0-1 will be maintained 07/15/2023 1333 by Barnett Hatter, RN Outcome: Progressing 07/26/2023 1328 by Barnett Hatter, RN Outcome: Progressing   Problem: Education: Goal: Knowledge of disease or condition will improve 07/11/2023 1333 by Barnett Hatter, RN Outcome: Not Progressing 07/09/2023 1328 by Barnett Hatter, RN Outcome: Not Progressing Goal: Knowledge of secondary prevention will improve (MUST DOCUMENT ALL) 07/09/2023 1333 by Barnett Hatter, RN Outcome: Not Progressing 07/14/2023 1328 by Barnett Hatter, RN Outcome: Not Progressing Goal: Knowledge of patient specific  risk factors will improve (DELETE if not current risk factor) 07/17/2023 1333 by Barnett Hatter, RN Outcome: Not Progressing 07/04/2023 1328 by Barnett Hatter, RN Outcome: Not Progressing   Problem: Ischemic Stroke/TIA Tissue Perfusion: Goal: Complications of ischemic stroke/TIA will be minimized 07/23/2023 1333 by Barnett Hatter, RN Outcome: Not Progressing 07/03/2023 1328 by Barnett Hatter, RN Outcome: Not Progressing   Problem: Coping: Goal: Will verbalize positive feelings about self 07/03/2023 1333 by Barnett Hatter, RN Outcome: Not Progressing 07/05/2023 1328 by Barnett Hatter, RN Outcome: Not Progressing Goal: Will identify appropriate support needs 07/08/2023 1333 by Barnett Hatter, RN Outcome: Not Progressing 06/29/2023 1328 by Barnett Hatter, RN Outcome: Not Progressing   Problem: Health Behavior/Discharge Planning: Goal: Ability to manage health-related needs will improve 07/26/2023 1333 by Barnett Hatter, RN Outcome: Not Progressing 07/17/2023 1328 by Barnett Hatter, RN Outcome: Not Progressing   Problem: Self-Care: Goal: Ability to participate in self-care as condition permits will improve 07/04/2023 1333 by Barnett Hatter, RN Outcome: Not Progressing 07/11/2023 1328 by Barnett Hatter, RN Outcome: Progressing Goal: Verbalization of feelings and concerns over difficulty with self-care will improve 07/13/2023 1333 by Barnett Hatter, RN Outcome: Not Progressing 07/10/2023 1328 by Barnett Hatter, RN Outcome: Not Progressing Goal: Ability to communicate needs accurately will improve 07/18/2023 1333 by Barnett Hatter, RN Outcome: Not Progressing 07/21/2023 1328 by Barnett Hatter, RN Outcome: Not Progressing   Problem: Nutrition: Goal: Risk of aspiration will decrease 07/01/2023 1333 by Barnett Hatter, RN Outcome: Not Progressing 07/05/2023 1328 by Barnett Hatter, RN Outcome: Progressing Goal: Dietary intake will improve 07/08/2023 1333 by Barnett Hatter,  RN Outcome: Not Progressing 06/30/2023 1328 by Barnett Hatter, RN Outcome: Progressing   Problem: Education: Goal: Knowledge of General Education information will improve  Description: Including pain rating scale, medication(s)/side effects and non-pharmacologic comfort measures 07/01/2023 1333 by Barnett Hatter, RN Outcome: Not Progressing 07/18/2023 1328 by Barnett Hatter, RN Outcome: Not Progressing   Problem: Health Behavior/Discharge Planning: Goal: Ability to manage health-related needs will improve 07/24/2023 1333 by Barnett Hatter, RN Outcome: Not Progressing 07/08/2023 1328 by Barnett Hatter, RN Outcome: Not Progressing   Problem: Clinical Measurements: Goal: Ability to maintain clinical measurements within normal limits will improve 07/18/2023 1333 by Barnett Hatter, RN Outcome: Not Progressing 07/10/2023 1328 by Barnett Hatter, RN Outcome: Progressing Goal: Will remain free from infection 07/23/2023 1333 by Barnett Hatter, RN Outcome: Not Progressing 07/10/2023 1328 by Barnett Hatter, RN Outcome: Progressing Goal: Diagnostic test results will improve 07/25/2023 1333 by Barnett Hatter, RN Outcome: Not Progressing 07/06/2023 1328 by Barnett Hatter, RN Outcome: Progressing Goal: Respiratory complications will improve 07/09/2023 1333 by Barnett Hatter, RN Outcome: Not Progressing 07/17/2023 1328 by Barnett Hatter, RN Outcome: Progressing Goal: Cardiovascular complication will be avoided 07/05/2023 1333 by Barnett Hatter, RN Outcome: Not Progressing 07/21/2023 1328 by Barnett Hatter, RN Outcome: Not Progressing   Problem: Activity: Goal: Risk for activity intolerance will decrease 07/18/2023 1333 by Barnett Hatter, RN Outcome: Not Progressing 07/02/2023 1328 by Barnett Hatter, RN Outcome: Progressing   Problem: Nutrition: Goal: Adequate nutrition will be maintained 07/16/2023 1333 by Barnett Hatter, RN Outcome: Not Progressing 07/08/2023 1328 by Barnett Hatter, RN Outcome: Progressing   Problem: Coping: Goal: Level of anxiety will decrease 07/11/2023 1333 by Barnett Hatter, RN Outcome: Not Progressing 07/06/2023 1328 by Barnett Hatter, RN Outcome: Progressing   Problem: Elimination: Goal: Will not experience complications related to bowel motility 07/26/2023 1333 by Barnett Hatter, RN Outcome: Not Progressing 07/12/2023 1328 by Barnett Hatter, RN Outcome: Not Progressing   Problem: Education: Goal: Understanding of CV disease, CV risk reduction, and recovery process will improve 07/08/2023 1333 by Barnett Hatter, RN Outcome: Not Progressing 07/21/2023 1328 by Barnett Hatter, RN Outcome: Progressing Goal: Individualized Educational Video(s) 07/04/2023 1333 by Barnett Hatter, RN Outcome: Not Progressing 07/18/2023 1328 by Barnett Hatter, RN Outcome: Progressing   Problem: Activity: Goal: Ability to return to baseline activity level will improve 07/18/2023 1333 by Barnett Hatter, RN Outcome: Not Progressing 07/02/2023 1328 by Barnett Hatter, RN Outcome: Progressing   Problem: Health Behavior/Discharge Planning: Goal: Ability to safely manage health-related needs after discharge will improve 07/03/2023 1333 by Barnett Hatter, RN Outcome: Not Progressing 06/29/2023 1328 by Barnett Hatter, RN Outcome: Progressing

## 2023-07-27 NOTE — Progress Notes (Signed)
 This chaplain responded to RN-[facility] page for EOL spiritual care. The chaplain is present with the Pt. family before and after compassionate extubation. The Pt. daughter-Seza and son in law-Josh are at the bedside. The Pt. daughter Melvia Heaps is traveling to the hospital from [place].  The family accepted the chaplain's invitation for storytelling. The chaplain understands the Pt. experiences life with joy. Quality of life included trips to the [address] and [place] with [employer]. The chaplain listened reflectively to Seza's anticipatory grief and her strength in recognizing quality of life for the Pt. in the family's decision to compassionately extubate.  The Pt. [place] faith brings spiritual comfort to the bedside.  The chaplain understands Kristen Loader will remain at the bedside. Education was provided on ways Kristen Loader can be the Pt. voice in  a comfortable EOL journey. This chaplain is available for F/U spiritual care as needed.  Chaplain Stephanie Acre [phone]

## 2023-07-27 NOTE — Progress Notes (Signed)
 PCCM Brief Note  Discussed with Dr. Viviann Spare of neurology. After further discussions with family, they requested compassionate one way extubation and comfort measures. Dr. Viviann Spare has placed orders. Pt was extubated at 1230.   Nothing further from our standpoint. Please call back if we can be of further assistance.   Rutherford Guys, PA - [facility] For pager details, please see AMION or use Epic chat  After 1900, please call [facility] for cross coverage needs 07/26/2023, 2:16 PM

## 2023-07-27 NOTE — Progress Notes (Signed)
 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 Corliss Skains)  Subjective:  Patient seen with Dr. Corliss Skains, remains intubated.  RN reports patient has been off sedation today, some response to painful stimuli but no eye opening.  More movement in left than right, right leg only withdraws from pain   Allergies: Valium [diazepam]  Medications: Prior to Admission medications   Medication Sig Start Date End Date Taking? Authorizing Provider  aspirin EC 81 MG tablet Take 81 mg by mouth daily. Swallow whole.   Yes [provider]  atorvastatin (LIPITOR) 20 MG tablet Take 20 mg by mouth daily.   Yes [provider]  calcitRIOL (ROCALTROL) 0.5 MCG capsule Take 0.5 mcg by mouth daily.   Yes [provider]  Cholecalciferol (D3 PO) Take 1 tablet by mouth daily.   Yes [provider]  Cyanocobalamin (B-12 PO) Take 1 tablet by mouth daily.   Yes [provider]  losartan (COZAAR) 50 MG tablet Take 50 mg by mouth daily.   Yes [provider]  meloxicam (MOBIC) 15 MG tablet Take 15 mg by mouth daily.   Yes [provider]  nystatin cream (MYCOSTATIN) Apply 1 Application topically daily as needed for dry skin.   Yes [provider]  omeprazole (PRILOSEC) 40 MG capsule Take 40 mg by mouth daily. 03/11/15  Yes [provider]  sertraline (ZOLOFT) 50 MG tablet Take 50 mg by mouth daily.   Yes [provider]     Vital Signs: BP (!) 106/42 (BP Location: Right Arm) Comment: see arterial line  Pulse 63   Temp 98.6 F (37 C) (Axillary)   Resp (!) 27   Ht 5' 2.01" (1.575 m)   Wt 158 lb 8.2 oz (71.9 kg)   SpO2 98%   BMI 28.98 kg/m   Physical Exam Vitals and nursing note reviewed.  Constitutional:      Comments: Intubated    HENT:     Head: Normocephalic.     Mouth/Throat:     Comments: OG in place Cardiovascular:     Rate and Rhythm: Normal rate and regular rhythm.     Comments: (+) right CFA puncture site clean, dry, dressed appropriately. Soft, non-pulsatile. No bleeding, significant bruising, erythema, edema or drainage. Patient grimaces with palpation.  Pulmonary:     Comments: Vent Abdominal:     Palpations: Abdomen is soft.  Musculoskeletal:     Cervical back: Neck supple.  Skin:    General: Skin is warm and dry.     Coloration: Skin is not jaundiced or pale.  Neurological:     Comments: Movement seen in left sider per RN, right leg only withdraws from pain. No eye opening.      Imaging: EEG adult Result Date: 07/21/2023 Charlsie Quest, MD     07/21/2023  3:46 PM Patient Name: Jillian Oconnor MRN: [id number] Epilepsy Attending: Charlsie Quest Referring Physician/Provider: Lynnae January, NP Date: 07/21/2023 Duration: 22.51 mins Patient history: 83yo F with jerk like movements in the LUE and LLE. EEG to evaluate for seizure Level of alertness: Awake/ lethargic AEDs during EEG study: Propofol Technical aspects: This EEG study was done with scalp electrodes positioned according to the 10-20 International system of electrode placement. Electrical activity was reviewed with band pass filter of 1-70Hz , sensitivity of 7 uV/mm,  display speed of 54mm/sec with a 60Hz  notched filter applied as appropriate. EEG data were recorded continuously and digitally stored.  Video monitoring was available and reviewed as appropriate. Description: EEG showed continuous generalized polymorphic sharply contoured 3 to 6 Hz theta-delta slowing. Generalized periodic discharges with triphasic morphology were noted at 1Hz , predominantly when awake/stimulated  Hyperventilation and photic stimulation were not performed.  Around 1337, patient was noted to have intermittent left leg jerking. Concomitant eeg before, during and  after the event didn't show any definite eeg change.  ABNORMALITY - Periodic discharges with triphasic morphology, generalized - Continuous slow, generalized IMPRESSION: This study is suggestive of moderate diffuse encephalopathy. Additionally there are periodic discharges with triphasic morphology which can be on the ictal-interictal continuum. However, the morphology, frequency of discharges and reactivity to stimulation is more commonly seen due to toxic-metabolic causes. Around 1337, patient was noted to have intermittent left leg jerking without concomitant eeg change. However, the semiology is concerning for focal motor seizure which may not be seen on scalp EEG. Clinical correlation  is recommended. Charlsie Quest   ECHOCARDIOGRAM COMPLETE Result Date: 07/21/2023    ECHOCARDIOGRAM REPORT   Patient Name:   Jillian Oconnor Date of Exam: 07/21/2023 Medical Rec #:  [id number]                 Height:       62.0 in Accession #:    [id number]                Weight:       152.3 lb Date of Birth:  [birth year]                 BSA:          1.703 m Patient Age:    82 years                  BP:           133/91 mmHg Patient Gender: F                         HR:           78 bpm. Exam Location:  Inpatient Procedure: 2D Echo (Both Spectral and Color Flow Doppler were utilized during            procedure). Indications:    stroke  History:        Patient has no prior history of Echocardiogram examinations.  Sonographer:    Delcie Roch RDCS Referring Phys: [id number] [facility]  Sonographer Comments: Image acquisition challenging due to patient body habitus. IMPRESSIONS  1. Left ventricular ejection fraction, by estimation, is >75%. The left ventricle has hyperdynamic function. The left ventricle has no regional wall motion abnormalities. There is mild left ventricular hypertrophy. Left ventricular diastolic parameters are consistent with Grade II diastolic dysfunction (pseudonormalization). Elevated left  atrial pressure. The E/e' is 20.  2. Right ventricular systolic function is normal. The right ventricular size is normal. There is mildly elevated pulmonary artery systolic pressure. The estimated right ventricular systolic pressure is 42.3 mmHg.  3. The mitral valve is degenerative. Trivial mitral valve regurgitation. No evidence of mitral stenosis.  4. The aortic valve was not well visualized. Aortic valve regurgitation is not visualized. No aortic stenosis is present.  5. The inferior vena cava is normal in size with <50% respiratory variability, suggesting right atrial pressure of 8 mmHg. Comparison(s): No prior Echocardiogram.  Conclusion(s)/Recommendation(s): No intracardiac source of embolism detected on this transthoracic study. Consider a transesophageal echocardiogram to exclude cardiac source of embolism if clinically indicated. FINDINGS  Left Ventricle: Left ventricular ejection fraction, by estimation, is >75%. The left ventricle has hyperdynamic function. The left ventricle has no regional wall motion abnormalities. Strain imaging was not performed. The left ventricular internal cavity size was normal in size. There is mild left ventricular hypertrophy. Left ventricular diastolic parameters are consistent with Grade II diastolic dysfunction (pseudonormalization). Elevated left atrial pressure. The E/e' is 20. Right Ventricle: The right ventricular size is normal. No increase in right ventricular wall thickness. Right ventricular systolic function is normal. There is mildly elevated pulmonary artery systolic pressure. The tricuspid regurgitant velocity is 2.93  m/s, and with an assumed right atrial pressure of 8 mmHg, the estimated right ventricular systolic pressure is 42.3 mmHg. Left Atrium: Left atrial size was normal in size. Right Atrium: Right atrial size was normal in size. Pericardium: There is no evidence of pericardial effusion. Mitral Valve: The mitral valve is degenerative in appearance.  Trivial mitral valve regurgitation. No evidence of mitral valve stenosis. Tricuspid Valve: The tricuspid valve is normal in structure. Tricuspid valve regurgitation is mild . No evidence of tricuspid stenosis. Aortic Valve: The aortic valve was not well visualized. Aortic valve regurgitation is not visualized. No aortic stenosis is present. Aortic valve mean gradient measures 14.0 mmHg. Aortic valve peak gradient measures 28.5 mmHg. Aortic valve area, by VTI measures 1.36 cm. Pulmonic Valve: The pulmonic valve was not well visualized. Pulmonic valve regurgitation is not visualized. No evidence of pulmonic stenosis. Aorta: The aortic root was not well visualized. Venous: IVC assessment for right atrial pressure unable to be performed due to mechanical ventilation. The inferior vena cava is normal in size with less than 50% respiratory variability, suggesting right atrial pressure of 8 mmHg. IAS/Shunts: The atrial septum is grossly normal. Additional Comments: 3D imaging was not performed.  LEFT VENTRICLE PLAX 2D LVIDd:         3.80 cm   Diastology LVIDs:         2.20 cm   LV e' medial:    4.79 cm/s LV PW:         1.10 cm   LV E/e' medial:  23.8 LV IVS:        1.10 cm   LV e' lateral:   6.96 cm/s LVOT diam:     1.70 cm   LV E/e' lateral: 16.4 LV SV:         65 LV SV Index:   38 LVOT Area:     2.27 cm  RIGHT VENTRICLE             IVC RV Basal diam:  2.50 cm     IVC diam: 1.80 cm RV S prime:     13.60 cm/s TAPSE (M-mode): 1.9 cm LEFT ATRIUM             Index        RIGHT ATRIUM           Index LA diam:        3.60 cm 2.11 cm/m   RA Area:     15.70 cm LA Vol (A2C):   56.3 ml 33.06 ml/m  RA Volume:   38.90 ml  22.84 ml/m LA Vol (A4C):   54.6 ml 32.06 ml/m LA Biplane Vol: 57.2 ml 33.59 ml/m  AORTIC VALVE AV Area (Vmax):    1.24 cm AV  Area (Vmean):   1.27 cm AV Area (VTI):     1.36 cm AV Vmax:           267.00 cm/s AV Vmean:          175.000 cm/s AV VTI:            0.478 m AV Peak Grad:      28.5 mmHg AV Mean  Grad:      14.0 mmHg LVOT Vmax:         145.50 cm/s LVOT Vmean:        97.850 cm/s LVOT VTI:          0.286 m LVOT/AV VTI ratio: 0.60  AORTA Ao Asc diam: 3.30 cm MITRAL VALVE                TRICUSPID VALVE MV Area (PHT): 2.45 cm     TR Peak grad:   34.3 mmHg MV Decel Time: 310 msec     TR Vmax:        293.00 cm/s MV E velocity: 114.00 cm/s MV A velocity: 104.00 cm/s  SHUNTS MV E/A ratio:  1.10         Systemic VTI:  0.29 m                             Systemic Diam: 1.70 cm Sunit Tolia Electronically signed by Tessa Lerner Signature Date/Time: 07/21/2023/11:45:35 AM    Final    DG Abd Portable 1V Result Date: 07/20/2023 CLINICAL DATA:  Orogastric tube placement EXAM: PORTABLE ABDOMEN - 1 VIEW COMPARISON:  CT chest and abdomen 05/09/2013. FINDINGS: Again seen is large hiatal hernia. The tip of an enteric tube is within the mid stomach above the level of the diaphragm within the hernia. No other dilated bowel loops are seen. IMPRESSION: Tip of enteric tube within the mid stomach above the level of the diaphragm within the large hiatal hernia. Electronically Signed   By: Darliss Cheney M.D.   On: 07/20/2023 21:08   DG Abd Portable 1V Result Date: 07/20/2023 CLINICAL DATA:  OG tube placement EXAM: PORTABLE ABDOMEN - 1 VIEW COMPARISON:  CT 05/09/2013 FINDINGS: Large hiatal hernia. OG tube tip is in the hiatal hernia. Nonobstructive bowel gas pattern. IMPRESSION: OG tube in the large hiatal hernia. Electronically Signed   By: Charlett Nose M.D.   On: 07/20/2023 21:07   MR BRAIN WO CONTRAST Result Date: 07/20/2023 CLINICAL DATA:  Stroke follow-up EXAM: MRI HEAD WITHOUT CONTRAST TECHNIQUE: Multiplanar, multiecho pulse sequences of the brain and surrounding structures were obtained without intravenous contrast. COMPARISON:  07/19/2023 FINDINGS: Brain: Multiple new areas of acute ischemia within both hemispheres. The largest area is in the left anterior cerebral artery territory. There are scattered new small foci  throughout the left MCA territory. There is a new infarct of the medial right thalamus (PCA territory). There is subarachnoid blood or contrast material over the left hemisphere. There is multifocal hyperintense T2-weighted signal within the periventricular and deep white matter. No hydrocephalus. Vascular: Normal flow voids. Skull and upper cervical spine: Normal marrow signal. Sinuses/Orbits: Negative. Other: None. IMPRESSION: 1. Multiple new areas of acute ischemia within both hemispheres, the largest area is in the left anterior cerebral artery territory. 2. 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 3. Subarachnoid blood or contrast material over the left hemisphere as shown on earlier head CT. Electronically Signed   By: Chrisandra Netters.D.  On: 07/20/2023 19:16   CT HEAD WO CONTRAST ( ) Result Date: 07/20/2023 CLINICAL DATA:  Stroke, follow up 4 hours post cangrelor infusion per MD EXAM: CT HEAD WITHOUT CONTRAST TECHNIQUE: Contiguous axial images were obtained from the base of the skull through the vertex without intravenous contrast. RADIATION DOSE REDUCTION: This exam was performed according to the departmental dose-optimization program which includes automated exposure control, adjustment of the mA and/or kV according to patient size and/or use of iterative reconstruction technique. COMPARISON:  CT head July 19, 2023. FINDINGS: Brain: Hyperdensity within left posterior sulci. No significant mass no evidence of acute large vascular territory infarct. New hypodensity and loss of gray differentiation in the anterior left frontal lobe is suspicious for acute infarct. No midline shift. No hydrocephalus. No mass lesion. Vascular: Interval placement of a left MCA stent. Skull: No acute fracture. Sinuses/Orbits: Clear sinuses.  No acute orbital findings. Other: No mastoid effusions. IMPRESSION: 1. New hyperdensity within left posterior sulci could represent contrast staining and/or  subarachnoid hemorrhage. Flat panel CT images from the catheter arteriogram are not available at this time for comparison. Recommend short interval follow-up head CT to assess stability. 2. New hypodensity and loss of gray differentiation in the anterior left frontal lobe is suspicious for acute infarct. MRI could confirm and further evaluate if clinically warranted. These results will be called to the ordering clinician or representative by the Radiologist Assistant, and communication documented in the PACS or [employer]. Electronically Signed   By: Feliberto Harts M.D.   On: 07/20/2023 10:32   DG CHEST PORT 1 VIEW Result Date: 07/20/2023 CLINICAL DATA:  Code stroke.  Ventilator dependence. EXAM: PORTABLE CHEST 1 VIEW COMPARISON:  05/29/2011 FINDINGS: Endotracheal tube tip is 2.4 cm above the base of the carina. The cardio pericardial silhouette is enlarged. Mild vascular congestion with likely underlying chronic interstitial disease. Large hiatal hernia again noted. No acute bony abnormality. Telemetry leads overlie the chest. IMPRESSION: 1. Endotracheal tube tip is 2.4 cm above the base of the carina. 2. Enlargement of the cardiopericardial silhouette with mild vascular congestion. 3. Large hiatal hernia. Electronically Signed   By: Kennith Center M.D.   On: 07/20/2023 08:35   CT ANGIO HEAD NECK W WO CM W PERF (CODE STROKE) Result Date: 07/20/2023 CLINICAL DATA:  83 year old female with neurologic deficit and punctate scattered infarcts in the left hemisphere on brain MRI yesterday. EXAM: CT ANGIOGRAPHY HEAD AND NECK CT PERFUSION BRAIN TECHNIQUE: Multidetector CT imaging of the head and neck was performed using the standard protocol during bolus administration of intravenous contrast. Multiplanar CT image reconstructions and MIPs were obtained to evaluate the vascular anatomy. Carotid stenosis measurements (when applicable) are obtained utilizing NASCET criteria, using the distal internal carotid  diameter as the denominator. Multiphase CT imaging of the brain was performed following IV bolus contrast injection. Subsequent parametric perfusion maps were calculated using RAPID software. RADIATION DOSE REDUCTION: This exam was performed according to the departmental dose-optimization program which includes automated exposure control, adjustment of the mA and/or kV according to patient size and/or use of iterative reconstruction technique. CONTRAST:  OMNIPAQUE IOHEXOL 350 MG/ML SOLN COMPARISON:  Brain MRI yesterday, CTA head and neck, CTP yesterday. FINDINGS: CT Brain Perfusion Findings: CBF (<30%) Volume: 0mL. Only minimal (4 mL) of deep left MCA territory white matter CBF/CBV abnormality. Perfusion (Tmax>6.0s) volume: 38mL, and now with 100 mL T-max greater than 4 sec in the left MCA territory which is progressed from 41 mL yesterday. Mismatch Volume: 38mL  Infarction Location:Left MCA CTA NECK Skeleton: Absent dentition and No acute osseous abnormality identified. Upper chest: Stable, negative. Other neck: Stable, negative. Aortic arch: Stable 3 vessel arch and arch atherosclerosis. Right carotid system: Stable tortuosity and atherosclerosis. Bulky right ICA origin calcified plaque. Stable stenosis numerically estimated at 60 % with respect to the distal vessel. Right ICA remains patent to the skull base. Left carotid system: Stable tortuosity and atherosclerosis. Bulky calcified plaque at the right ICA origin resulting in estimated 65 % stenosis with respect to the distal vessel (series 7, image 93). Left ICA remains patent to the skull base. Vertebral arteries: Dominant right vertebral artery. Stable proximal subclavian atherosclerosis and tortuosity. No significant vertebral artery stenosis in the neck. Stable non dominant and diminutive left vertebral to the skull base. CTA HEAD Posterior circulation: Stable, left vertebral functionally terminates in PICA. Mild right V4 calcified plaque. Patent,  somewhat diminutive basilar artery without stenosis. Fetal type PCA origins. Patent SCA origins. Bilateral PCA branches are patent and stable with mild irregularity. Anterior circulation: Both ICA siphons are patent with calcified atherosclerosis, stable. No hemodynamically significant siphon stenosis. Normal posterior communicating artery origins. Patent carotid termini, MCA and ACA origins. Bilateral ACA and right MCA branches are stable with no hemodynamically significant stenosis. Ongoing severe left MCA midportion M1 stenosis series 12, image 17. Near occlusion of the vessel there. Distal enhancement with patent left MCA bifurcation. Compared to yesterday there is less robust enhancement of the MCA branches (series 13, image 26), but also CTA timing is earlier now. Venous sinuses: Earlier timing, not well evaluated. Anatomic variants: Dominant right vertebral artery, left terminates in PICA. Fetal type PCA origins peer Review of the MIP images confirms the above findings IMPRESSION: 1. Some progression of abnormal perfusion parameters in the left MCA territory since yesterday. Stable CTA appearance of severe/critical Left MCA M1 stenosis. These results were communicated to Dr. Amada Jupiter at 0429 hours on 07/20/2023 by text page via the [facility] messaging system. 2. Stable CTA Head and neck elsewhere including bulky calcified plaque at both ICA origins with estimated 60-65% bilateral ICA origin stenosis, slightly greater on the left. 3.  Aortic Atherosclerosis (ICD10-I70.0). Electronically Signed   By: Odessa Fleming M.D.   On: 07/20/2023 04:32   MR BRAIN WO CONTRAST Result Date: 07/19/2023 CLINICAL DATA:  Acute neurologic deficit EXAM: MRI HEAD WITHOUT CONTRAST TECHNIQUE: Multiplanar, multiecho pulse sequences of the brain and surrounding structures were obtained without intravenous contrast. COMPARISON:  03/17/2018 FINDINGS: Brain: There are multiple punctate foci of abnormal diffusion restriction within the left  frontal white matter and left basal ganglia. No acute or chronic hemorrhage. There is multifocal hyperintense T2-weighted signal within the white matter. Parenchymal volume and CSF spaces are normal. The midline structures are normal. Vascular: Normal flow voids. Skull and upper cervical spine: Normal calvarium and skull base. Visualized upper cervical spine and soft tissues are normal. Sinuses/Orbits:No paranasal sinus fluid levels or advanced mucosal thickening. No mastoid or middle ear effusion. Normal orbits. IMPRESSION: Multiple punctate acute infarcts within the left frontal white matter and left basal ganglia. No hemorrhage or mass effect. Electronically Signed   By: Deatra Robinson M.D.   On: 07/19/2023 23:13   CT ANGIO HEAD NECK W WO CM W PERF (CODE STROKE) Result Date: 07/19/2023 CLINICAL DATA:  Neuro deficit.  Aphasia.  Right-sided weakness. EXAM: CT ANGIOGRAPHY HEAD AND NECK CT PERFUSION BRAIN TECHNIQUE: Multidetector CT imaging of the head and neck was performed using the standard protocol during bolus administration  of intravenous contrast. Multiplanar CT image reconstructions and MIPs were obtained to evaluate the vascular anatomy. Carotid stenosis measurements (when applicable) are obtained utilizing NASCET criteria, using the distal internal carotid diameter as the denominator. Multiphase CT imaging of the brain was performed following IV bolus contrast injection. Subsequent parametric perfusion maps were calculated using RAPID software. RADIATION DOSE REDUCTION: This exam was performed according to the departmental dose-optimization program which includes automated exposure control, adjustment of the mA and/or kV according to patient size and/or use of iterative reconstruction technique. CONTRAST:  OMNIPAQUE IOHEXOL 350 MG/ML SOLN COMPARISON:  CT head without contrast 07/19/2023. MR head without and with contrast 03/17/2018 FINDINGS: CTA NECK FINDINGS Aortic arch: Atherosclerotic  calcifications are present at the aortic arch and great vessel origins. No focal stenosis is present. No aneurysm or dissection is present. Right carotid system: The right common carotid artery is within normal limits. Atherosclerotic calcifications are present at the right carotid bifurcation. The lumen is narrowed to 2 point 4 mm. This compares with a more distal segment of 4.2 mm is below the skull base. Left carotid system: The left common carotid artery is within normal limits. Atherosclerotic calcifications are present at the bifurcation. No significant stenosis of greater than 50% is present relative to the more distal vessel. Vertebral arteries: The right vertebral artery is dominant vessel. Both vertebral arteries originate from the subclavian arteries without significant stenosis. No significant stenosis is present in either vertebral artery in the neck. Skeleton: Multilevel degenerative changes are present in the cervical spine. Uncovertebral spurring and foraminal stenosis is greatest at C4-5 bilaterally. No focal osseous lesions are present. The patient is edentulous. Other neck: Soft tissues the neck are otherwise unremarkable. Salivary glands are within normal limits. Thyroid is normal. No significant adenopathy is present. No focal mucosal or submucosal lesions are present. Upper chest: The lung apices are clear. The thoracic inlet is within normal limits. Review of the MIP images confirms the above findings CTA HEAD FINDINGS Anterior circulation: Atherosclerotic calcifications are present within the cavernous internal carotid arteries bilaterally. No significant stenosis is present through the ICA termini. A high-grade stenosis is present in the mid left M1 segment. The right M1 segment is normal. The A1 segments are normal bilaterally. The anterior communicating artery is patent. Right MCA bifurcation is within limits. Moderate proximal M2 stenoses are present on the left. Asymmetric attenuation of  left MCA branch vessels is noted compared to the right. The ACA and right MCA branch vessels are within normal limits. No aneurysm is present. Posterior circulation: The right vertebral artery is dominant. PICA origin normal bilaterally. Left vertebral artery is centrally terminates at the left PICA. A very small distal V4 segment is present. The basilar artery is small. The superior cerebellar arteries are patent. A fetal type right posterior cerebral artery is present. The left posterior cerebral artery originates from basilar tip. A prominent left posterior communicating artery contributes. A small right P1 segment contributes. The PCA branch vessels are normal bilaterally. No aneurysm is present. Venous sinuses: The dural sinuses are patent. The straight sinus and deep cerebral veins are intact. Cortical veins are within normal limits. 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 Anatomic variants: Fetal type right posterior cerebral artery and prominent left posterior communicating artery. Review of the MIP images confirms the above findings CT Brain Perfusion Findings: ASPECTS: 10/10 CBF (<30%) Volume: 0mL Perfusion (Tmax>6.0s) volume: 0mL Tmax>4.0s is 41 mL in the left MCA territory. Mismatch Volume: 0mL IMPRESSION: 1. 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 2. Moderate proximal M2 stenoses on the left. 3. Asymmetric attenuation of left MCA branch vessels compared to the right. 4. No infarct core on CT perfusion. 5. Although no ischemic penumbra is present utilizing Tmax>6.0s a penumbra is suggested using a shorter time frame caught off. 6. Atherosclerotic changes at the right carotid bifurcation produce a greater than 50% stenosis of the proximal right internal carotid artery. 7. No significant stenosis of the left carotid bifurcation or vertebral arteries in the neck. 8. Multilevel degenerative changes in the cervical spine. Critical Value/emergent results were called by telephone at the time of  interpretation on 07/19/2023 at 6:12 pm to provider Dr. Wilford Corner, who verbally acknowledged these results. Electronically Signed   By: Marin Roberts M.D.   On: 07/19/2023 18:32   CT HEAD CODE STROKE WO CONTRAST Result Date: 07/19/2023 CLINICAL DATA:  Code stroke. EXAM: CT HEAD WITHOUT CONTRAST TECHNIQUE: Contiguous axial images were obtained from the base of the skull through the vertex without intravenous contrast. RADIATION DOSE REDUCTION: This exam was performed according to the departmental dose-optimization program which includes automated exposure control, adjustment of the mA and/or kV according to patient size and/or use of iterative reconstruction technique. COMPARISON:  MR head without contrast scratched at MR head without and with contrast 03/17/2018 FINDINGS: Brain: Mild atrophy and white matter changes demonstrate some progression since the prior exam. No acute infarct, hemorrhage, or mass lesion is present. The ventricles are of normal size. Deep brain nuclei are within normal limits. Insert normal brainstem Midline structures are within normal limits. Vascular: Mild atherosclerotic calcifications are present within the cavernous internal carotid arteries bilaterally. No hyperdense vessel is present. Skull: Calvarium is intact. No focal lytic or blastic lesions are present. No significant extracranial soft tissue lesion is present. Sinuses/Orbits: The paranasal sinuses and mastoid air cells are clear. The globes and orbits are within normal limits. ASPECTS [facility] Stroke Program Early CT Score) - Ganglionic level infarction (caudate, lentiform nuclei, internal capsule, insula, M1-M3 cortex): 7/7 - Supraganglionic infarction (M4-M6 cortex): 3/3 Total score (0-10 with 10 being normal): 10/10 IMPRESSION: 1. No acute intracranial abnormality or significant interval change. Aspects is 10/10 2. Slight progression of mild atrophy and white matter disease. This likely reflects the sequela of chronic  microvascular ischemia. Electronically Signed   By: Marin Roberts M.D.   On: 07/19/2023 18:04    Labs:  CBC: Recent Labs    07/19/23 1755 07/19/23 1758 07/20/23 0417 07/21/23 0520 07/21/23 2215  WBC 7.1  --  6.0 11.1*  --   HGB 12.9 12.2 11.5* 10.7* 9.2*  HCT 38.2 36.0 33.2* 31.6* 27.0*  PLT 188  --  157 175  --     COAGS: Recent Labs    07/19/23 1755  INR 1.0  APTT 24    BMP: Recent Labs    07/20/23 0417 07/20/23 2130 07/21/23 0520 07/21/23 2202 07/21/23 2215  NA 139 138 137 135 137  K 3.2* 3.7 3.5 4.0 4.0  CL 103 110 110 107  --   CO2 23 18* 18* 19*  --   GLUCOSE 121* 186* 160* 164*  --   BUN 9 12 13 22   --   CALCIUM 9.2 8.5* 8.8* 9.0  --   CREATININE 0.57 0.83 0.79 0.79  --   GFRNONAA >60 >60 >60 >60  --     LIVER FUNCTION TESTS: Recent Labs    07/19/23 1755  BILITOT 0.4  AST 22  ALT  15  ALKPHOS 46  PROT 6.4*  ALBUMIN 3.9    Assessment and Plan:  83 y/o F admitted 07/19/23 with occluded left MCA mid M1 segment s/p cerebral angiogram with successful revascularization with stent assisted balloon angioplasty 2/22 in NIR (Dr. Corliss Skains). Seen today for post procedure follow up.  Patient remains intubated, off sedation today.  Responses to painful stimuli, no eye opening yet.   Right CFA puncture site unremarkable.  Plan: - Continue ASA 81 mg + Plavix 75 mg - NIR will follow, please call with questions or concerns - Plan per neurology/primary team  Electronically Signed: Willette Brace, PA-C 07/02/2023, 9:24 AM   I spent a total of 15 Minutes at the the patient's bedside AND on the patient's hospital floor or unit, greater than 50% of which was counseling/coordinating care for occluded left MCA.

## 2023-07-27 NOTE — Plan of Care (Deleted)
  Problem: Health Behavior/Discharge Planning: Goal: Goals will be collaboratively established with patient/family Outcome: Progressing   Problem: Self-Care: Goal: Ability to participate in self-care as condition permits will improve Outcome: Progressing   Problem: Nutrition: Goal: Risk of aspiration will decrease Outcome: Progressing Goal: Dietary intake will improve Outcome: Progressing   Problem: Clinical Measurements: Goal: Ability to maintain clinical measurements within normal limits will improve Outcome: Progressing Goal: Will remain free from infection Outcome: Progressing Goal: Diagnostic test results will improve Outcome: Progressing Goal: Respiratory complications will improve Outcome: Progressing   Problem: Activity: Goal: Risk for activity intolerance will decrease Outcome: Progressing   Problem: Nutrition: Goal: Adequate nutrition will be maintained Outcome: Progressing   Problem: Coping: Goal: Level of anxiety will decrease Outcome: Progressing   Problem: Elimination: Goal: Will not experience complications related to urinary retention Outcome: Progressing   Problem: Pain Managment: Goal: General experience of comfort will improve and/or be controlled Outcome: Progressing   Problem: Safety: Goal: Ability to remain free from injury will improve Outcome: Progressing   Problem: Skin Integrity: Goal: Risk for impaired skin integrity will decrease Outcome: Progressing   Problem: Education: Goal: Understanding of CV disease, CV risk reduction, and recovery process will improve Outcome: Progressing Goal: Individualized Educational Video(s) Outcome: Progressing   Problem: Activity: Goal: Ability to return to baseline activity level will improve Outcome: Progressing   Problem: Cardiovascular: Goal: Ability to achieve and maintain adequate cardiovascular perfusion will improve Outcome: Progressing Goal: Vascular access site(s) Level 0-1 will be  maintained Outcome: Progressing   Problem: Health Behavior/Discharge Planning: Goal: Ability to safely manage health-related needs after discharge will improve Outcome: Progressing   Problem: Education: Goal: Knowledge of disease or condition will improve Outcome: Not Progressing Goal: Knowledge of secondary prevention will improve (MUST DOCUMENT ALL) Outcome: Not Progressing Goal: Knowledge of patient specific risk factors will improve (DELETE if not current risk factor) Outcome: Not Progressing   Problem: Ischemic Stroke/TIA Tissue Perfusion: Goal: Complications of ischemic stroke/TIA will be minimized Outcome: Not Progressing   Problem: Coping: Goal: Will verbalize positive feelings about self Outcome: Not Progressing Goal: Will identify appropriate support needs Outcome: Not Progressing   Problem: Health Behavior/Discharge Planning: Goal: Ability to manage health-related needs will improve Outcome: Not Progressing   Problem: Self-Care: Goal: Verbalization of feelings and concerns over difficulty with self-care will improve Outcome: Not Progressing Goal: Ability to communicate needs accurately will improve Outcome: Not Progressing   Problem: Education: Goal: Knowledge of General Education information will improve Description: Including pain rating scale, medication(s)/side effects and non-pharmacologic comfort measures Outcome: Not Progressing   Problem: Health Behavior/Discharge Planning: Goal: Ability to manage health-related needs will improve Outcome: Not Progressing   Problem: Clinical Measurements: Goal: Cardiovascular complication will be avoided Outcome: Not Progressing   Problem: Elimination: Goal: Will not experience complications related to bowel motility Outcome: Not Progressing

## 2023-07-27 NOTE — IPAL (Signed)
  Interdisciplinary Goals of Care Family Meeting   Date carried out: 07/05/2023  Location of the meeting: Bedside  Member's involved: Physician, Bedside Registered Nurse, Social Worker, and Family Member or next of kin  Durable Power of Attorney or acting medical decision maker: Sisters.  Discussion: We discussed goals of care for [facility] .   Discussed with daughter and son in law as well as another daughter over the phone in group discussion.  Sisters are adamant she would not want to live if she cannot live independently.  She showed me her DNR papers and cremation directives.  Patient was well prepared to end of life and it's clear she would only want to live if completed independent.  Discussed that she may continue on for several days to weeks. They understand this.  She is not brain dead.  They want full comport care and withdrawal of all support and to ensure she remains comfortable.  Gave them the option of waiting for another day to see improvement but they are clear in their decision today.   Discussed with Dr. Denese Killings and ICU APP  All questions answered to their satisfaction.    Code status:   Code Status: Do not attempt resuscitation (DNR) PRE-ARREST INTERVENTIONS DESIRED   Disposition: In-patient comfort care  Time spent for the meeting: 25    Jillian Rutherford, MD  07/21/2023, 11:07 AM

## 2023-07-27 NOTE — Death Summary Note (Signed)
 DEATH SUMMARY   Patient Details  Name: Jillian Oconnor MRN: [id number] DOB: [birth year]  Admission/Discharge Information   Admit Date:  08/14/23  Date of Death: Date of Death: 17-Aug-2023  Time of Death: Time of Death: 1350  Length of Stay: 3  Referring Physician: Barbette Reichmann, MD   Reason(s) for Hospitalization   Expand All Collapse All  STROKE TEAM PROGRESS NOTE      SIGNIFICANT HOSPITAL EVENTS 14-Aug-2023 admitted for acute onset of aphasia and right-sided weakness.  CTA with high-grade stenosis of left M1, moderate proximal M2 stenosis.  CT perfusion no ischemic penumbra   2/22 developed acute global aphasia.  CTA/CTP demonstrated significant area of penumbra.  Went to IR, had an occluded left middle cerebral artery mid M1 segment with extensive atherosclerotic plaque, stent assisted balloon angioplasty with TICI 2C revascularization Repeat CT head this morning with New hyperdensity within left posterior sulci could represent contrast staining and/or subarachnoid hemorrhage and New hypodensity and loss of gray differentiation in the anterior left frontal lobe is suspicious for acute infarct.  Repeat MRI head with multiple areas of acute ischemia within both hemispheres, the largest area is in the left anterior cerebral artery territory. New infarct in the right medial thalamus. No significant improvement of her exam when sedation is off.    INTERIM HISTORY/SUBJECTIVE Family at bedside.  Patient's daughter and son-in-law.  Over the phone with her other daughter.  They are in tears.  They know she would not want to live like this.  She was very independent and already had her cremation papers filled out as well as her healthcare directives.  Initially the plan was to wait till tomorrow but they are ready to make a decision today.  Lengthy discussion in the room.  I discussed that she may live for few days or even weeks.  They are prepared for that.  After much discussion they  were adamant that these were her wishes and they do not want to wait.  Palliative care was consulted and comfort care measures were placed.      Diagnoses  Preliminary cause of death:  Secondary Diagnoses (including complications and co-morbidities):  Principal Problem:   Stroke [facility]) Active Problems:   Middle cerebral artery stenosis, left   Brief Hospital Course (including significant findings, care, treatment, and services provided and events leading to death)  Jillian Oconnor is a 83 y.o. year old female who  Jillian Oconnor is a 83 y.o. female with history of hyperlipidemia, anxiety, GERD presented from home  admitted for aphasia and right-sided weakness.  NIH on Admission 7   Acute Ischemic Infarct:  Multiple areas of ischemic stroke in the both hemisphere, largest in the left ACA territory and new right thalamic stroke s/p IR for occluded left middle cerebral artery mid M1 stent assisted balloon angioplasty with TICI 2C revascularization.  Etiology: Large vessel atherosclerotic disease vs. Artery to artery embolization Code Stroke CT head No acute abnormality. Small vessel disease. Atrophy. ASPECTS 10.    CTA head & neck High-grade stenosis of the mid left M1 segment. Moderate proximal M2 stenoses on the left. Repeat CTA Some progression of abnormal perfusion parameters in the left MCA territory CT perfusion No infarct core on CT perfusion.  Post IR CT focal hyperattenuation in the subarachnoid space overlying the left parietal region representing a mixture of contrast and blood.  MRI  Multiple punctate acute infarcts within the left frontal white matter and left basal ganglia. Repeat CT head-  New hyperdensity within left posterior sulci could represent contrast staining and/or subarachnoid hemorrhage.New hypodensity and loss of gray differentiation in the anterior left frontal lobe is suspicious for acute infarct.  Repeat MRI Brain: Multiple new areas of  acute ischemia within both hemispheres, the largest area is in the left anterior cerebral artery territory. 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 Subarachnoid blood or contrast material over the left hemisphere as shown on earlier head CT 2D Echo EF>75 % LDL 73 HgbA1c 5.3 VTE prophylaxis -SCDs aspirin 81 mg daily prior to admission, now on aspirin 81 mg daily and clopidogrel 75 mg daily  Therapy recommendations:  Pending Disposition: Pending   Hypertensive Emergency  Home meds: Losartan 50 mg UnStable On Cleviprex at 20 mg As needed labetalol and hydralazine contain BP goal SBP goal 1 20-1 40 for 24 hours then less than 160   Hyperlipidemia Home meds: Atorvastatin 20 mg, resumed in hospital LDL 73, goal < 70 Continue statin at discharge   Acute respiratory failure status post procedure Management per CCM  on propofol drip Protective measures     Dysphagia Patient has post-stroke dysphagia, SLP consulted      Diet    Diet NPO time specified        Advance diet as tolerated   Other Stroke Risk Factors ETOH use, alcohol level <10, advised to drink no more than 1 drink(s) a day   Other Active Problems Hypokalemia-K3.2-replaced-check in the morning Jerk like movements in the LUE and LLE, will order a routine EEG    Hospital day # 3   Lengthy discussion as noted above.  She is a DNR family decided to move to comfort care measures.  Chaplain services were offered as well as palliative care consult.  Patient passed quickly after 1 week terminal extubation.    Pertinent Labs and Studies  Significant Diagnostic Studies EEG adult Result Date: 07/21/2023 Jillian Quest, MD     07/21/2023  3:46 PM Patient Name: Jillian Oconnor MRN: [id number] Epilepsy Attending: Charlsie Oconnor Referring Physician/Provider: Lynnae January, NP Date: 07/21/2023 Duration: 22.51 mins Patient history: 83yo F with jerk like movements in the LUE and LLE. EEG to  evaluate for seizure Level of alertness: Awake/ lethargic AEDs during EEG study: Propofol Technical aspects: This EEG study was done with scalp electrodes positioned according to the 10-20 International system of electrode placement. Electrical activity was reviewed with band pass filter of 1-70Hz , sensitivity of 7 uV/mm, display speed of 73mm/sec with a 60Hz  notched filter applied as appropriate. EEG data were recorded continuously and digitally stored.  Video monitoring was available and reviewed as appropriate. Description: EEG showed continuous generalized polymorphic sharply contoured 3 to 6 Hz theta-delta slowing. Generalized periodic discharges with triphasic morphology were noted at 1Hz , predominantly when awake/stimulated  Hyperventilation and photic stimulation were not performed.  Around 1337, patient was noted to have intermittent left leg jerking. Concomitant eeg before, during and after the event didn't show any definite eeg change.  ABNORMALITY - Periodic discharges with triphasic morphology, generalized - Continuous slow, generalized IMPRESSION: This study is suggestive of moderate diffuse encephalopathy. Additionally there are periodic discharges with triphasic morphology which can be on the ictal-interictal continuum. However, the morphology, frequency of discharges and reactivity to stimulation is more commonly seen due to toxic-metabolic causes. Around 1337, patient was noted to have intermittent left leg jerking without concomitant eeg change. However, the semiology is concerning for focal motor seizure which may not be  seen on scalp EEG. Clinical correlation  is recommended. Jillian Oconnor   ECHOCARDIOGRAM COMPLETE Result Date: 07/21/2023    ECHOCARDIOGRAM REPORT   Patient Name:   Jillian Oconnor Date of Exam: 07/21/2023 Medical Rec #:  [id number]                 Height:       62.0 in Accession #:    [id number]                Weight:       152.3 lb Date of Birth:  01-25-1941                  BSA:          1.703 m Patient Age:    82 years                  BP:           133/91 mmHg Patient Gender: F                         HR:           78 bpm. Exam Location:  Inpatient Procedure: 2D Echo (Both Spectral and Color Flow Doppler were utilized during            procedure). Indications:    stroke  History:        Patient has no prior history of Echocardiogram examinations.  Sonographer:    Delcie Roch RDCS Referring Phys: [id number] [facility]  Sonographer Comments: Image acquisition challenging due to patient body habitus. IMPRESSIONS  1. Left ventricular ejection fraction, by estimation, is >75%. The left ventricle has hyperdynamic function. The left ventricle has no regional wall motion abnormalities. There is mild left ventricular hypertrophy. Left ventricular diastolic parameters are consistent with Grade II diastolic dysfunction (pseudonormalization). Elevated left atrial pressure. The E/e' is 20.  2. Right ventricular systolic function is normal. The right ventricular size is normal. There is mildly elevated pulmonary artery systolic pressure. The estimated right ventricular systolic pressure is 42.3 mmHg.  3. The mitral valve is degenerative. Trivial mitral valve regurgitation. No evidence of mitral stenosis.  4. The aortic valve was not well visualized. Aortic valve regurgitation is not visualized. No aortic stenosis is present.  5. The inferior vena cava is normal in size with <50% respiratory variability, suggesting right atrial pressure of 8 mmHg. Comparison(s): No prior Echocardiogram. Conclusion(s)/Recommendation(s): No intracardiac source of embolism detected on this transthoracic study. Consider a transesophageal echocardiogram to exclude cardiac source of embolism if clinically indicated. FINDINGS  Left Ventricle: Left ventricular ejection fraction, by estimation, is >75%. The left ventricle has hyperdynamic function. The left ventricle has no regional wall motion abnormalities.  Strain imaging was not performed. The left ventricular internal cavity size was normal in size. There is mild left ventricular hypertrophy. Left ventricular diastolic parameters are consistent with Grade II diastolic dysfunction (pseudonormalization). Elevated left atrial pressure. The E/e' is 20. Right Ventricle: The right ventricular size is normal. No increase in right ventricular wall thickness. Right ventricular systolic function is normal. There is mildly elevated pulmonary artery systolic pressure. The tricuspid regurgitant velocity is 2.93  m/s, and with an assumed right atrial pressure of 8 mmHg, the estimated right ventricular systolic pressure is 42.3 mmHg. Left Atrium: Left atrial size was normal in size. Right Atrium: Right atrial size was normal in size. Pericardium: There is no evidence of pericardial  effusion. Mitral Valve: The mitral valve is degenerative in appearance. Trivial mitral valve regurgitation. No evidence of mitral valve stenosis. Tricuspid Valve: The tricuspid valve is normal in structure. Tricuspid valve regurgitation is mild . No evidence of tricuspid stenosis. Aortic Valve: The aortic valve was not well visualized. Aortic valve regurgitation is not visualized. No aortic stenosis is present. Aortic valve mean gradient measures 14.0 mmHg. Aortic valve peak gradient measures 28.5 mmHg. Aortic valve area, by VTI measures 1.36 cm. Pulmonic Valve: The pulmonic valve was not well visualized. Pulmonic valve regurgitation is not visualized. No evidence of pulmonic stenosis. Aorta: The aortic root was not well visualized. Venous: IVC assessment for right atrial pressure unable to be performed due to mechanical ventilation. The inferior vena cava is normal in size with less than 50% respiratory variability, suggesting right atrial pressure of 8 mmHg. IAS/Shunts: The atrial septum is grossly normal. Additional Comments: 3D imaging was not performed.  LEFT VENTRICLE PLAX 2D LVIDd:         3.80  cm   Diastology LVIDs:         2.20 cm   LV e' medial:    4.79 cm/s LV PW:         1.10 cm   LV E/e' medial:  23.8 LV IVS:        1.10 cm   LV e' lateral:   6.96 cm/s LVOT diam:     1.70 cm   LV E/e' lateral: 16.4 LV SV:         65 LV SV Index:   38 LVOT Area:     2.27 cm  RIGHT VENTRICLE             IVC RV Basal diam:  2.50 cm     IVC diam: 1.80 cm RV S prime:     13.60 cm/s TAPSE (M-mode): 1.9 cm LEFT ATRIUM             Index        RIGHT ATRIUM           Index LA diam:        3.60 cm 2.11 cm/m   RA Area:     15.70 cm LA Vol (A2C):   56.3 ml 33.06 ml/m  RA Volume:   38.90 ml  22.84 ml/m LA Vol (A4C):   54.6 ml 32.06 ml/m LA Biplane Vol: 57.2 ml 33.59 ml/m  AORTIC VALVE AV Area (Vmax):    1.24 cm AV Area (Vmean):   1.27 cm AV Area (VTI):     1.36 cm AV Vmax:           267.00 cm/s AV Vmean:          175.000 cm/s AV VTI:            0.478 m AV Peak Grad:      28.5 mmHg AV Mean Grad:      14.0 mmHg LVOT Vmax:         145.50 cm/s LVOT Vmean:        97.850 cm/s LVOT VTI:          0.286 m LVOT/AV VTI ratio: 0.60  AORTA Ao Asc diam: 3.30 cm MITRAL VALVE                TRICUSPID VALVE MV Area (PHT): 2.45 cm     TR Peak grad:   34.3 mmHg MV Decel Time: 310 msec     TR Vmax:  293.00 cm/s MV E velocity: 114.00 cm/s MV A velocity: 104.00 cm/s  SHUNTS MV E/A ratio:  1.10         Systemic VTI:  0.29 m                             Systemic Diam: 1.70 cm Sunit Tolia Electronically signed by Tessa Lerner Signature Date/Time: 07/21/2023/11:45:35 AM    Final    DG Abd Portable 1V Result Date: 07/20/2023 CLINICAL DATA:  Orogastric tube placement EXAM: PORTABLE ABDOMEN - 1 VIEW COMPARISON:  CT chest and abdomen 05/09/2013. FINDINGS: Again seen is large hiatal hernia. The tip of an enteric tube is within the mid stomach above the level of the diaphragm within the hernia. No other dilated bowel loops are seen. IMPRESSION: Tip of enteric tube within the mid stomach above the level of the diaphragm within the large hiatal  hernia. Electronically Signed   By: Darliss Cheney M.D.   On: 07/20/2023 21:08   DG Abd Portable 1V Result Date: 07/20/2023 CLINICAL DATA:  OG tube placement EXAM: PORTABLE ABDOMEN - 1 VIEW COMPARISON:  CT 05/09/2013 FINDINGS: Large hiatal hernia. OG tube tip is in the hiatal hernia. Nonobstructive bowel gas pattern. IMPRESSION: OG tube in the large hiatal hernia. Electronically Signed   By: Charlett Nose M.D.   On: 07/20/2023 21:07   MR BRAIN WO CONTRAST Result Date: 07/20/2023 CLINICAL DATA:  Stroke follow-up EXAM: MRI HEAD WITHOUT CONTRAST TECHNIQUE: Multiplanar, multiecho pulse sequences of the brain and surrounding structures were obtained without intravenous contrast. COMPARISON:  07/19/2023 FINDINGS: Brain: Multiple new areas of acute ischemia within both hemispheres. The largest area is in the left anterior cerebral artery territory. There are scattered new small foci throughout the left MCA territory. There is a new infarct of the medial right thalamus (PCA territory). There is subarachnoid blood or contrast material over the left hemisphere. There is multifocal hyperintense T2-weighted signal within the periventricular and deep white matter. No hydrocephalus. Vascular: Normal flow voids. Skull and upper cervical spine: Normal marrow signal. Sinuses/Orbits: Negative. Other: None. IMPRESSION: 1. Multiple new areas of acute ischemia within both hemispheres, the largest area is in the left anterior cerebral artery territory. 2. 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 3. Subarachnoid blood or contrast material over the left hemisphere as shown on earlier head CT. Electronically Signed   By: Deatra Robinson M.D.   On: 07/20/2023 19:16   CT HEAD WO CONTRAST ( ) Result Date: 07/20/2023 CLINICAL DATA:  Stroke, follow up 4 hours post cangrelor infusion per MD EXAM: CT HEAD WITHOUT CONTRAST TECHNIQUE: Contiguous axial images were obtained from the base of the skull through the vertex without  intravenous contrast. RADIATION DOSE REDUCTION: This exam was performed according to the departmental dose-optimization program which includes automated exposure control, adjustment of the mA and/or kV according to patient size and/or use of iterative reconstruction technique. COMPARISON:  CT head July 19, 2023. FINDINGS: Brain: Hyperdensity within left posterior sulci. No significant mass no evidence of acute large vascular territory infarct. New hypodensity and loss of gray differentiation in the anterior left frontal lobe is suspicious for acute infarct. No midline shift. No hydrocephalus. No mass lesion. Vascular: Interval placement of a left MCA stent. Skull: No acute fracture. Sinuses/Orbits: Clear sinuses.  No acute orbital findings. Other: No mastoid effusions. IMPRESSION: 1. New hyperdensity within left posterior sulci could represent contrast staining and/or subarachnoid hemorrhage. Flat panel  CT images from the catheter arteriogram are not available at this time for comparison. Recommend short interval follow-up head CT to assess stability. 2. New hypodensity and loss of gray differentiation in the anterior left frontal lobe is suspicious for acute infarct. MRI could confirm and further evaluate if clinically warranted. These results will be called to the ordering clinician or representative by the Radiologist Assistant, and communication documented in the PACS or [employer]. Electronically Signed   By: Feliberto Harts M.D.   On: 07/20/2023 10:32   DG CHEST PORT 1 VIEW Result Date: 07/20/2023 CLINICAL DATA:  Code stroke.  Ventilator dependence. EXAM: PORTABLE CHEST 1 VIEW COMPARISON:  05/29/2011 FINDINGS: Endotracheal tube tip is 2.4 cm above the base of the carina. The cardio pericardial silhouette is enlarged. Mild vascular congestion with likely underlying chronic interstitial disease. Large hiatal hernia again noted. No acute bony abnormality. Telemetry leads overlie the chest.  IMPRESSION: 1. Endotracheal tube tip is 2.4 cm above the base of the carina. 2. Enlargement of the cardiopericardial silhouette with mild vascular congestion. 3. Large hiatal hernia. Electronically Signed   By: Kennith Center M.D.   On: 07/20/2023 08:35   CT ANGIO HEAD NECK W WO CM W PERF (CODE STROKE) Result Date: 07/20/2023 CLINICAL DATA:  83 year old female with neurologic deficit and punctate scattered infarcts in the left hemisphere on brain MRI yesterday. EXAM: CT ANGIOGRAPHY HEAD AND NECK CT PERFUSION BRAIN TECHNIQUE: Multidetector CT imaging of the head and neck was performed using the standard protocol during bolus administration of intravenous contrast. Multiplanar CT image reconstructions and MIPs were obtained to evaluate the vascular anatomy. Carotid stenosis measurements (when applicable) are obtained utilizing NASCET criteria, using the distal internal carotid diameter as the denominator. Multiphase CT imaging of the brain was performed following IV bolus contrast injection. Subsequent parametric perfusion maps were calculated using RAPID software. RADIATION DOSE REDUCTION: This exam was performed according to the departmental dose-optimization program which includes automated exposure control, adjustment of the mA and/or kV according to patient size and/or use of iterative reconstruction technique. CONTRAST:  OMNIPAQUE IOHEXOL 350 MG/ML SOLN COMPARISON:  Brain MRI yesterday, CTA head and neck, CTP yesterday. FINDINGS: CT Brain Perfusion Findings: CBF (<30%) Volume: 0mL. Only minimal (4 mL) of deep left MCA territory white matter CBF/CBV abnormality. Perfusion (Tmax>6.0s) volume: 38mL, and now with 100 mL T-max greater than 4 sec in the left MCA territory which is progressed from 41 mL yesterday. Mismatch Volume: 38mL Infarction Location:Left MCA CTA NECK Skeleton: Absent dentition and No acute osseous abnormality identified. Upper chest: Stable, negative. Other neck: Stable, negative. Aortic  arch: Stable 3 vessel arch and arch atherosclerosis. Right carotid system: Stable tortuosity and atherosclerosis. Bulky right ICA origin calcified plaque. Stable stenosis numerically estimated at 60 % with respect to the distal vessel. Right ICA remains patent to the skull base. Left carotid system: Stable tortuosity and atherosclerosis. Bulky calcified plaque at the right ICA origin resulting in estimated 65 % stenosis with respect to the distal vessel (series 7, image 93). Left ICA remains patent to the skull base. Vertebral arteries: Dominant right vertebral artery. Stable proximal subclavian atherosclerosis and tortuosity. No significant vertebral artery stenosis in the neck. Stable non dominant and diminutive left vertebral to the skull base. CTA HEAD Posterior circulation: Stable, left vertebral functionally terminates in PICA. Mild right V4 calcified plaque. Patent, somewhat diminutive basilar artery without stenosis. Fetal type PCA origins. Patent SCA origins. Bilateral PCA branches are patent and stable with mild irregularity.  Anterior circulation: Both ICA siphons are patent with calcified atherosclerosis, stable. No hemodynamically significant siphon stenosis. Normal posterior communicating artery origins. Patent carotid termini, MCA and ACA origins. Bilateral ACA and right MCA branches are stable with no hemodynamically significant stenosis. Ongoing severe left MCA midportion M1 stenosis series 12, image 17. Near occlusion of the vessel there. Distal enhancement with patent left MCA bifurcation. Compared to yesterday there is less robust enhancement of the MCA branches (series 13, image 26), but also CTA timing is earlier now. Venous sinuses: Earlier timing, not well evaluated. Anatomic variants: Dominant right vertebral artery, left terminates in PICA. Fetal type PCA origins peer Review of the MIP images confirms the above findings IMPRESSION: 1. Some progression of abnormal perfusion parameters in the  left MCA territory since yesterday. Stable CTA appearance of severe/critical Left MCA M1 stenosis. These results were communicated to Dr. Amada Jupiter at 0429 hours on 07/20/2023 by text page via the [facility] messaging system. 2. Stable CTA Head and neck elsewhere including bulky calcified plaque at both ICA origins with estimated 60-65% bilateral ICA origin stenosis, slightly greater on the left. 3.  Aortic Atherosclerosis (ICD10-I70.0). Electronically Signed   By: Odessa Fleming M.D.   On: 07/20/2023 04:32   MR BRAIN WO CONTRAST Result Date: 07/19/2023 CLINICAL DATA:  Acute neurologic deficit EXAM: MRI HEAD WITHOUT CONTRAST TECHNIQUE: Multiplanar, multiecho pulse sequences of the brain and surrounding structures were obtained without intravenous contrast. COMPARISON:  03/17/2018 FINDINGS: Brain: There are multiple punctate foci of abnormal diffusion restriction within the left frontal white matter and left basal ganglia. No acute or chronic hemorrhage. There is multifocal hyperintense T2-weighted signal within the white matter. Parenchymal volume and CSF spaces are normal. The midline structures are normal. Vascular: Normal flow voids. Skull and upper cervical spine: Normal calvarium and skull base. Visualized upper cervical spine and soft tissues are normal. Sinuses/Orbits:No paranasal sinus fluid levels or advanced mucosal thickening. No mastoid or middle ear effusion. Normal orbits. IMPRESSION: Multiple punctate acute infarcts within the left frontal white matter and left basal ganglia. No hemorrhage or mass effect. Electronically Signed   By: Deatra Robinson M.D.   On: 07/19/2023 23:13   CT ANGIO HEAD NECK W WO CM W PERF (CODE STROKE) Result Date: 07/19/2023 CLINICAL DATA:  Neuro deficit.  Aphasia.  Right-sided weakness. EXAM: CT ANGIOGRAPHY HEAD AND NECK CT PERFUSION BRAIN TECHNIQUE: Multidetector CT imaging of the head and neck was performed using the standard protocol during bolus administration of intravenous  contrast. Multiplanar CT image reconstructions and MIPs were obtained to evaluate the vascular anatomy. Carotid stenosis measurements (when applicable) are obtained utilizing NASCET criteria, using the distal internal carotid diameter as the denominator. Multiphase CT imaging of the brain was performed following IV bolus contrast injection. Subsequent parametric perfusion maps were calculated using RAPID software. RADIATION DOSE REDUCTION: This exam was performed according to the departmental dose-optimization program which includes automated exposure control, adjustment of the mA and/or kV according to patient size and/or use of iterative reconstruction technique. CONTRAST:  OMNIPAQUE IOHEXOL 350 MG/ML SOLN COMPARISON:  CT head without contrast 07/19/2023. MR head without and with contrast 03/17/2018 FINDINGS: CTA NECK FINDINGS Aortic arch: Atherosclerotic calcifications are present at the aortic arch and great vessel origins. No focal stenosis is present. No aneurysm or dissection is present. Right carotid system: The right common carotid artery is within normal limits. Atherosclerotic calcifications are present at the right carotid bifurcation. The lumen is narrowed to 2 point 4 mm. This compares with  a more distal segment of 4.2 mm is below the skull base. Left carotid system: The left common carotid artery is within normal limits. Atherosclerotic calcifications are present at the bifurcation. No significant stenosis of greater than 50% is present relative to the more distal vessel. Vertebral arteries: The right vertebral artery is dominant vessel. Both vertebral arteries originate from the subclavian arteries without significant stenosis. No significant stenosis is present in either vertebral artery in the neck. Skeleton: Multilevel degenerative changes are present in the cervical spine. Uncovertebral spurring and foraminal stenosis is greatest at C4-5 bilaterally. No focal osseous lesions are present.  The patient is edentulous. Other neck: Soft tissues the neck are otherwise unremarkable. Salivary glands are within normal limits. Thyroid is normal. No significant adenopathy is present. No focal mucosal or submucosal lesions are present. Upper chest: The lung apices are clear. The thoracic inlet is within normal limits. Review of the MIP images confirms the above findings CTA HEAD FINDINGS Anterior circulation: Atherosclerotic calcifications are present within the cavernous internal carotid arteries bilaterally. No significant stenosis is present through the ICA termini. A high-grade stenosis is present in the mid left M1 segment. The right M1 segment is normal. The A1 segments are normal bilaterally. The anterior communicating artery is patent. Right MCA bifurcation is within limits. Moderate proximal M2 stenoses are present on the left. Asymmetric attenuation of left MCA branch vessels is noted compared to the right. The ACA and right MCA branch vessels are within normal limits. No aneurysm is present. Posterior circulation: The right vertebral artery is dominant. PICA origin normal bilaterally. Left vertebral artery is centrally terminates at the left PICA. A very small distal V4 segment is present. The basilar artery is small. The superior cerebellar arteries are patent. A fetal type right posterior cerebral artery is present. The left posterior cerebral artery originates from basilar tip. A prominent left posterior communicating artery contributes. A small right P1 segment contributes. The PCA branch vessels are normal bilaterally. No aneurysm is present. Venous sinuses: The dural sinuses are patent. The straight sinus and deep cerebral veins are intact. Cortical veins are within normal limits. 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 Anatomic variants: Fetal type right posterior cerebral artery and prominent left posterior communicating artery. Review of the MIP images confirms the above findings CT  Brain Perfusion Findings: ASPECTS: 10/10 CBF (<30%) Volume: 0mL Perfusion (Tmax>6.0s) volume: 0mL Tmax>4.0s is 41 mL in the left MCA territory. Mismatch Volume: 0mL IMPRESSION: 1. 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 2. Moderate proximal M2 stenoses on the left. 3. Asymmetric attenuation of left MCA branch vessels compared to the right. 4. No infarct core on CT perfusion. 5. Although no ischemic penumbra is present utilizing Tmax>6.0s a penumbra is suggested using a shorter time frame caught off. 6. Atherosclerotic changes at the right carotid bifurcation produce a greater than 50% stenosis of the proximal right internal carotid artery. 7. No significant stenosis of the left carotid bifurcation or vertebral arteries in the neck. 8. Multilevel degenerative changes in the cervical spine. Critical Value/emergent results were called by telephone at the time of interpretation on 07/19/2023 at 6:12 pm to provider Dr. Wilford Corner, who verbally acknowledged these results. Electronically Signed   By: Marin Roberts M.D.   On: 07/19/2023 18:32   CT HEAD CODE STROKE WO CONTRAST Result Date: 07/19/2023 CLINICAL DATA:  Code stroke. EXAM: CT HEAD WITHOUT CONTRAST TECHNIQUE: Contiguous axial images were obtained from the base of the skull through the vertex without intravenous  contrast. RADIATION DOSE REDUCTION: This exam was performed according to the departmental dose-optimization program which includes automated exposure control, adjustment of the mA and/or kV according to patient size and/or use of iterative reconstruction technique. COMPARISON:  MR head without contrast scratched at MR head without and with contrast 03/17/2018 FINDINGS: Brain: Mild atrophy and white matter changes demonstrate some progression since the prior exam. No acute infarct, hemorrhage, or mass lesion is present. The ventricles are of normal size. Deep brain nuclei are within normal limits. Insert normal brainstem Midline structures are  within normal limits. Vascular: Mild atherosclerotic calcifications are present within the cavernous internal carotid arteries bilaterally. No hyperdense vessel is present. Skull: Calvarium is intact. No focal lytic or blastic lesions are present. No significant extracranial soft tissue lesion is present. Sinuses/Orbits: The paranasal sinuses and mastoid air cells are clear. The globes and orbits are within normal limits. ASPECTS [facility] Stroke Program Early CT Score) - Ganglionic level infarction (caudate, lentiform nuclei, internal capsule, insula, M1-M3 cortex): 7/7 - Supraganglionic infarction (M4-M6 cortex): 3/3 Total score (0-10 with 10 being normal): 10/10 IMPRESSION: 1. No acute intracranial abnormality or significant interval change. Aspects is 10/10 2. Slight progression of mild atrophy and white matter disease. 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 Electronically Signed   By: Marin Roberts M.D.   On: 07/19/2023 18:04    Microbiology Recent Results (from the past 240 hours)  Resp panel by RT-PCR (RSV, Flu A&B, Covid) Anterior Nasal Swab     Status: None   Collection Time: 07/19/23  6:46 PM   Specimen: Anterior Nasal Swab  Result Value Ref Range Status   SARS Coronavirus 2 by RT PCR NEGATIVE NEGATIVE Final   Influenza A by PCR NEGATIVE NEGATIVE Final   Influenza B by PCR NEGATIVE NEGATIVE Final    Comment: (NOTE) The Xpert Xpress SARS-CoV-2/FLU/RSV plus assay is intended as an aid in the diagnosis of influenza from Nasopharyngeal swab specimens and should not be used as a sole basis for treatment. Nasal washings and aspirates are unacceptable for Xpert Xpress SARS-CoV-2/FLU/RSV testing.  Fact Sheet for Patients: BloggerCourse.com  Fact Sheet for Healthcare Providers: SeriousBroker.it  This test is not yet approved or cleared by the [place] FDA and has been authorized for detection and/or  diagnosis of SARS-CoV-2 by FDA under an Emergency Use Authorization (EUA). This EUA will remain in effect (meaning this test can be used) for the duration of the COVID-19 declaration under Section 564(b)(1) of the Act, 21 U.S.C. section 360bbb-3(b)(1), unless the authorization is terminated or revoked.     Resp Syncytial Virus by PCR NEGATIVE NEGATIVE Final    Comment: (NOTE) Fact Sheet for Patients: BloggerCourse.com  Fact Sheet for Healthcare Providers: SeriousBroker.it  This test is not yet approved or cleared by the [place] FDA and has been authorized for detection and/or diagnosis of SARS-CoV-2 by FDA under an Emergency Use Authorization (EUA). This EUA will remain in effect (meaning this test can be used) for the duration of the COVID-19 declaration under Section 564(b)(1) of the Act, 21 U.S.C. section 360bbb-3(b)(1), unless the authorization is terminated or revoked.  Performed at [facility], [address], [place], [place] [address]   MRSA Next Gen by PCR, Nasal     Status: None   Collection Time: 07/19/23  7:47 PM   Specimen: Nasal Mucosa; Nasal Swab  Result Value Ref Range Status   MRSA by PCR Next Gen NOT DETECTED NOT DETECTED Final  Comment: (NOTE) The GeneXpert MRSA Assay (FDA approved for NASAL specimens only), is one component of a comprehensive MRSA colonization surveillance program. It is not intended to diagnose MRSA infection nor to guide or monitor treatment for MRSA infections. Test performance is not FDA approved in patients less than 58 years old. Performed at [facility], [address] [place], [place] [address]     Lab Basic Metabolic Panel: Recent Labs  Lab 07/19/23 1755 07/19/23 1758 07/20/23 0417 07/20/23 2130 07/21/23 0520 07/21/23 1709 07/21/23 2202 07/21/23 2215 07/03/2023 0500  NA 139 139 139 138 137  --  135 137  --   K 4.0 4.0 3.2* 3.7 3.5  --  4.0 4.0   --   CL 104 104 103 110 110  --  107  --   --   CO2 25  --  23 18* 18*  --  19*  --   --   GLUCOSE 135* 128* 121* 186* 160*  --  164*  --   --   BUN 17 18 9 12 13   --  22  --   --   CREATININE 0.80 0.80 0.57 0.83 0.79  --  0.79  --   --   CALCIUM 9.8  --  9.2 8.5* 8.8*  --  9.0  --   --   MG  --   --   --  1.6*  --  2.6*  --   --  2.4  PHOS  --   --   --   --   --  2.7  --   --  3.2   Liver Function Tests: Recent Labs  Lab 07/19/23 1755  AST 22  ALT 15  ALKPHOS 46  BILITOT 0.4  PROT 6.4*  ALBUMIN 3.9   No results for input(s): "LIPASE", "AMYLASE" in the last 168 hours. No results for input(s): "AMMONIA" in the last 168 hours. CBC: Recent Labs  Lab 07/19/23 1755 07/19/23 1758 07/20/23 0417 07/21/23 0520 07/21/23 2215  WBC 7.1  --  6.0 11.1*  --   NEUTROABS 5.5  --   --  9.6*  --   HGB 12.9 12.2 11.5* 10.7* 9.2*  HCT 38.2 36.0 33.2* 31.6* 27.0*  MCV 86.6  --  84.5 86.8  --   PLT 188  --  157 175  --    Cardiac Enzymes: No results for input(s): "CKTOTAL", "CKMB", "CKMBINDEX", "TROPONINI" in the last 168 hours. Sepsis Labs: Recent Labs  Lab 07/19/23 1755 07/20/23 0417 07/21/23 0520  WBC 7.1 6.0 11.1*    Procedures/Operations     Harolyn Rutherford 07/17/2023, 6:11 PM

## 2023-07-27 NOTE — Progress Notes (Signed)
 Pt looking uncomfortable post-extubation. MD to order ativan for pt comfort.

## 2023-07-27 DEATH — deceased

## 2023-08-08 ENCOUNTER — Encounter (HOSPITAL_COMMUNITY): Payer: Self-pay | Admitting: Interventional Radiology

## 2023-08-08 DIAGNOSIS — I639 Cerebral infarction, unspecified: Secondary | ICD-10-CM

## 2023-09-02 IMAGING — DX DG KNEE COMPLETE 4+V*R*
4 series · 4 of 4 positions shown · non-contrast
Comparison: None.

CLINICAL DATA: Knee pain

EXAM:
RIGHT KNEE - COMPLETE 4+ VIEW

[knee ap]
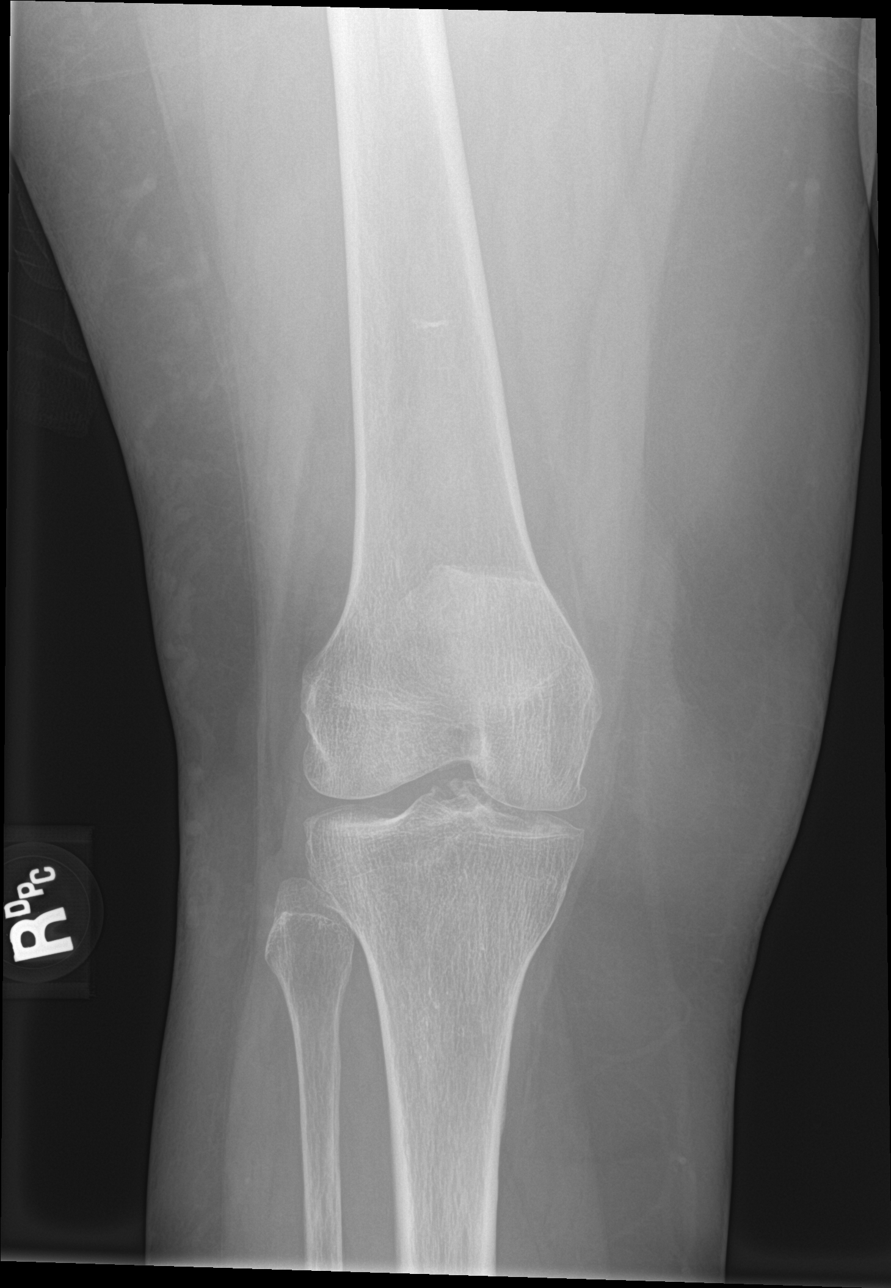

[knee lat]
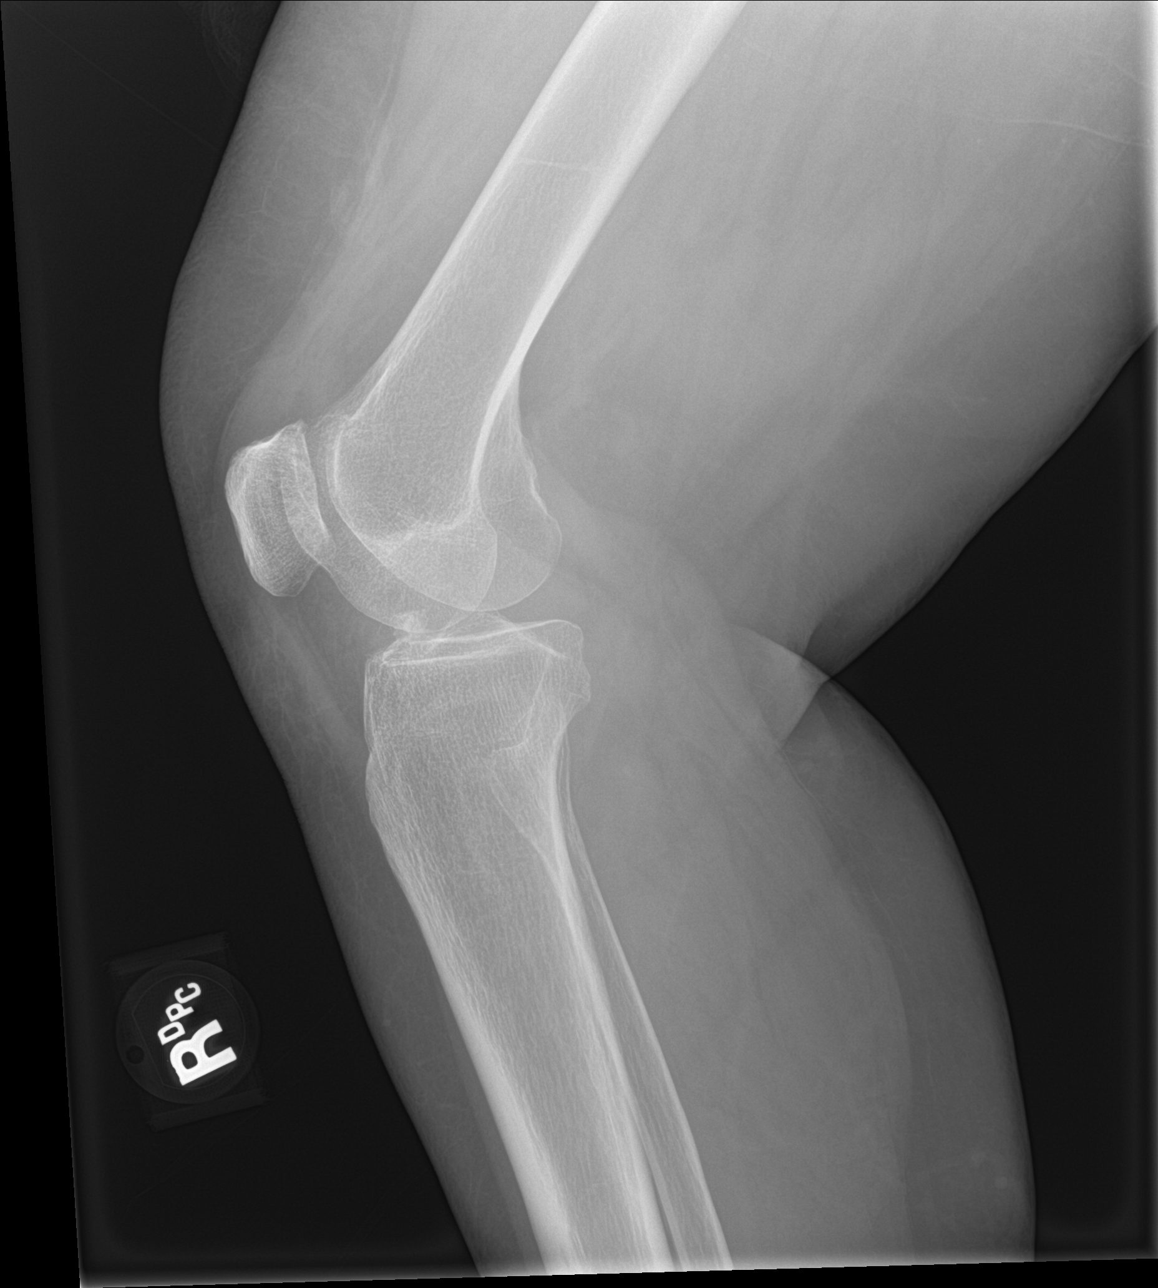

[knee obl (1 of 2)]
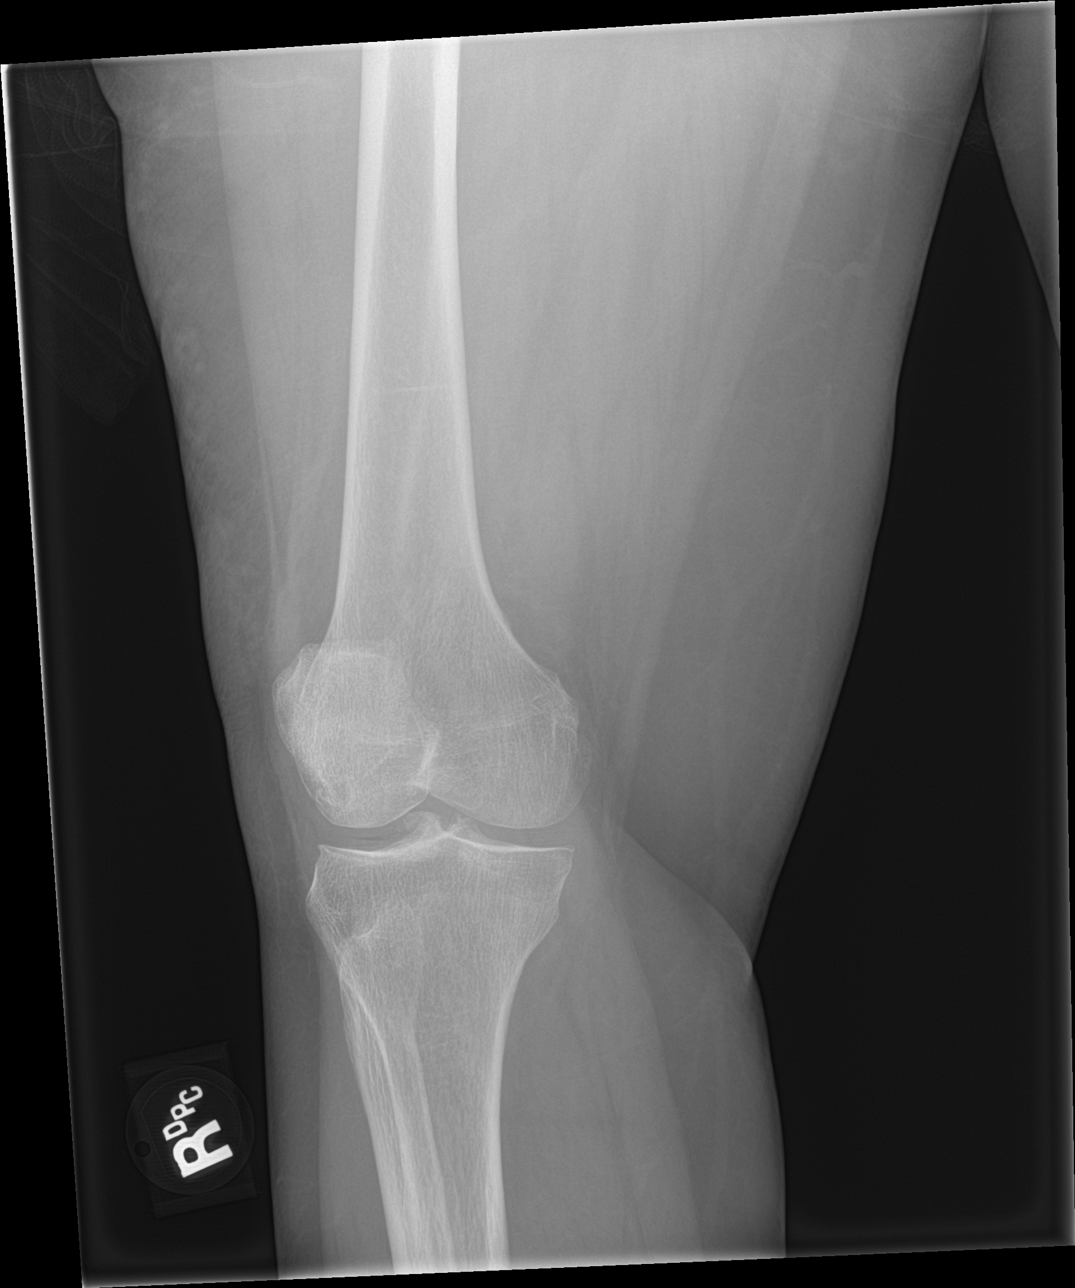

[knee obl (2 of 2)]
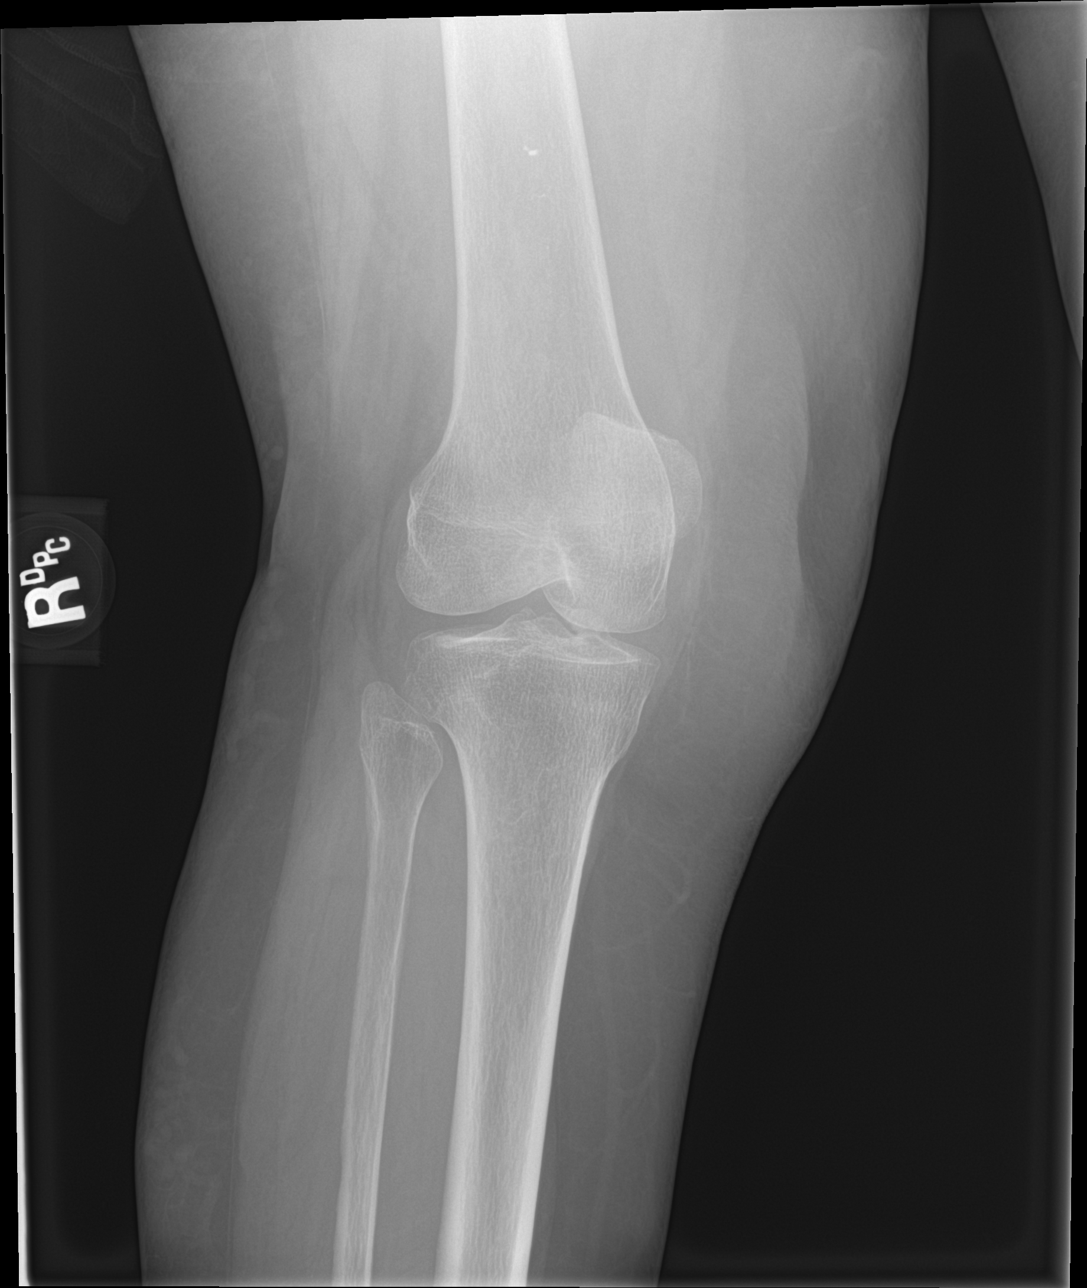

[4 of 4 positions shown; findings below may reference images not displayed]

FINDINGS: No evidence of fracture, dislocation, or joint effusion. No evidence
of arthropathy or other focal bone abnormality. Soft tissues are
unremarkable.
IMPRESSION: Negative.
